# Patient Record
Sex: Female | Born: 1960 | Race: Black or African American | Hispanic: No | Marital: Married | Smoking: Never smoker
Health system: Southern US, Community
[De-identification: ages and names within clinical notes are randomized; demographics above are authoritative.]

## PROBLEM LIST (undated history)

## (undated) DIAGNOSIS — D472 Monoclonal gammopathy: Secondary | ICD-10-CM

## (undated) DIAGNOSIS — M1990 Unspecified osteoarthritis, unspecified site: Secondary | ICD-10-CM

## (undated) DIAGNOSIS — K219 Gastro-esophageal reflux disease without esophagitis: Secondary | ICD-10-CM

## (undated) DIAGNOSIS — I10 Essential (primary) hypertension: Secondary | ICD-10-CM

## (undated) DIAGNOSIS — E119 Type 2 diabetes mellitus without complications: Secondary | ICD-10-CM

## (undated) DIAGNOSIS — E7800 Pure hypercholesterolemia, unspecified: Secondary | ICD-10-CM

## (undated) HISTORY — PX: PANCREAS SURGERY: SHX731

## (undated) HISTORY — PX: CHOLECYSTECTOMY: SHX55

## (undated) HISTORY — PX: ABDOMINAL HYSTERECTOMY: SHX81

---

## 2007-11-28 ENCOUNTER — Ambulatory Visit: Payer: Self-pay | Admitting: Family Medicine

## 2008-12-23 ENCOUNTER — Ambulatory Visit: Payer: Self-pay | Admitting: Family Medicine

## 2009-11-05 ENCOUNTER — Ambulatory Visit: Payer: Self-pay | Admitting: Anesthesiology

## 2009-12-28 ENCOUNTER — Ambulatory Visit: Payer: Self-pay | Admitting: Family Medicine

## 2010-01-01 ENCOUNTER — Ambulatory Visit: Payer: Self-pay | Admitting: Podiatry

## 2010-12-05 ENCOUNTER — Ambulatory Visit: Payer: Self-pay | Admitting: Family Medicine

## 2011-02-22 ENCOUNTER — Ambulatory Visit: Payer: Self-pay | Admitting: Family Medicine

## 2011-09-24 ENCOUNTER — Ambulatory Visit: Payer: Self-pay | Admitting: Internal Medicine

## 2011-09-27 HISTORY — PX: BREAST BIOPSY: SHX20

## 2012-08-21 ENCOUNTER — Ambulatory Visit: Payer: Self-pay | Admitting: Family Medicine

## 2013-09-29 ENCOUNTER — Ambulatory Visit: Payer: Self-pay

## 2013-09-29 LAB — RAPID INFLUENZA A&B ANTIGENS

## 2013-10-31 ENCOUNTER — Ambulatory Visit: Payer: Self-pay | Admitting: Family Medicine

## 2014-11-03 ENCOUNTER — Ambulatory Visit: Payer: Self-pay | Admitting: Physician Assistant

## 2014-12-24 ENCOUNTER — Ambulatory Visit: Admit: 2014-12-24 | Disposition: A | Discharge status: Home / Self Care | Payer: Self-pay | Admitting: Family Medicine

## 2014-12-26 HISTORY — PX: BREAST BIOPSY: SHX20

## 2014-12-31 ENCOUNTER — Ambulatory Visit
Admit: 2014-12-31 | Disposition: A | Discharge status: Home / Self Care | Payer: Self-pay | Attending: Family Medicine | Admitting: Family Medicine

## 2015-01-05 ENCOUNTER — Ambulatory Visit
Admit: 2015-01-05 | Disposition: A | Discharge status: Home / Self Care | Payer: Self-pay | Attending: Family Medicine | Admitting: Family Medicine

## 2015-01-19 LAB — SURGICAL PATHOLOGY

## 2015-05-14 ENCOUNTER — Encounter: Payer: Self-pay | Admitting: Emergency Medicine

## 2015-05-14 ENCOUNTER — Ambulatory Visit
Admission: EM | Admit: 2015-05-14 | Discharge: 2015-05-14 | Disposition: A | Discharge status: Home / Self Care | Payer: Federal, State, Local not specified - PPO | Attending: Family Medicine | Admitting: Family Medicine

## 2015-05-14 DIAGNOSIS — M545 Low back pain, unspecified: Secondary | ICD-10-CM

## 2015-05-14 HISTORY — DX: Pure hypercholesterolemia, unspecified: E78.00

## 2015-05-14 HISTORY — DX: Type 2 diabetes mellitus without complications: E11.9

## 2015-05-14 HISTORY — DX: Essential (primary) hypertension: I10

## 2015-05-14 MED ORDER — MELOXICAM 15 MG PO TABS: 15.0000 mg | ORAL_TABLET | Freq: Every day | ORAL | Status: DC

## 2015-05-14 MED ORDER — ORPHENADRINE CITRATE ER 100 MG PO TB12: 100.0000 mg | ORAL_TABLET | Freq: Two times a day (BID) | ORAL | Status: DC

## 2015-05-14 MED ORDER — TRAMADOL HCL 50 MG PO TABS: 50.0000 mg | ORAL_TABLET | Freq: Four times a day (QID) | ORAL | Status: DC | PRN

## 2015-05-14 NOTE — ED Notes (Signed)
Patient c/o lower back pain after lifting a large pack of water bottles about a month ago.  Patient also report back spasms.

## 2015-05-14 NOTE — Discharge Instructions (Signed)
Back Exercises Back exercises help treat and prevent back injuries. The goal is to increase your strength in your belly (abdominal) and back muscles. These exercises can also help with flexibility. Start these exercises when told by your doctor. HOME CARE Back exercises include: Pelvic Tilt.  Lie on your back with your knees bent. Tilt your pelvis until the lower part of your back is against the floor. Hold this position 5 to 10 sec. Repeat this exercise 5 to 10 times. Knee to Chest.  Pull 1 knee up against your chest and hold for 20 to 30 seconds. Repeat this with the other knee. This may be done with the other leg straight or bent, whichever feels better. Then, pull both knees up against your chest. Sit-Ups or Curl-Ups.  Bend your knees 90 degrees. Start with tilting your pelvis, and do a partial, slow sit-up. Only lift your upper half 30 to 45 degrees off the floor. Take at least 2 to 3 seonds for each sit-up. Do not do sit-ups with your knees out straight. If partial sit-ups are difficult, simply do the above but with only tightening your belly (abdominal) muscles and holding it as told. Hip-Lift.  Lie on your back with your knees flexed 90 degrees. Push down with your feet and shoulders as you raise your hips 2 inches off the floor. Hold for 10 seconds, repeat 5 to 10 times. Back Arches.  Lie on your stomach. Prop yourself up on bent elbows. Slowly press on your hands, causing an arch in your low back. Repeat 3 to 5 times. Shoulder-Lifts.  Lie face down with arms beside your body. Keep hips and belly pressed to floor as you slowly lift your head and shoulders off the floor. Do not overdo your exercises. Be careful in the beginning. Exercises may cause you some mild back discomfort. If the pain lasts for more than 15 minutes, stop the exercises until you see your doctor. Improvement with exercise for back problems is slow.  Document Released: 10/15/2010 Document Revised: 12/05/2011  Document Reviewed: 07/14/2011 [facility] Patient Information 2015 [place], [place]. This information is not intended to replace advice given to you by your health care provider. Make sure you discuss any questions you have with your health care provider.  Back Pain, Adult Back pain is very common. The pain often gets better over time. The cause of back pain is usually not dangerous. Most people can learn to manage their back pain on their own.  HOME CARE   Stay active. Start with short walks on flat ground if you can. Try to walk farther each day.  Do not sit, drive, or stand in one place for more than 30 minutes. Do not stay in bed.  Do not avoid exercise or work. Activity can help your back heal faster.  Be careful when you bend or lift an object. Bend at your knees, keep the object close to you, and do not twist.  Sleep on a firm mattress. Lie on your side, and bend your knees. If you lie on your back, put a pillow under your knees.  Only take medicines as told by your doctor.  Put ice on the injured area.  Put ice in a plastic bag.  Place a towel between your skin and the bag.  Leave the ice on for 15-20 minutes, 03-04 times a day for the first 2 to 3 days. After that, you can switch between ice and heat packs.  Ask your doctor about back exercises  or massage.  Avoid feeling anxious or stressed. Find good ways to deal with stress, such as exercise. GET HELP RIGHT AWAY IF:   Your pain does not go away with rest or medicine.  Your pain does not go away in 1 week.  You have new problems.  You do not feel well.  The pain spreads into your legs.  You cannot control when you poop (bowel movement) or pee (urinate).  Your arms or legs feel weak or lose feeling (numbness).  You feel sick to your stomach (nauseous) or throw up (vomit).  You have belly (abdominal) pain.  You feel like you may pass out (faint). MAKE SURE YOU:   Understand these instructions.  Will watch  your condition.  Will get help right away if you are not doing well or get worse. Document Released: 02/29/2008 Document Revised: 12/05/2011 Document Reviewed: 01/14/2014 [facility] Patient Information 2015 [place], [place]. This information is not intended to replace advice given to you by your health care provider. Make sure you discuss any questions you have with your health care provider.

## 2015-05-14 NOTE — ED Provider Notes (Signed)
CSN: [id number]     Arrival date & time 05/14/15  0747 History   First MD Initiated Contact with Patient 05/14/15 [phone]     Chief Complaint  Patient presents with  . Back Pain   (Consider location/radiation/quality/duration/timing/severity/associated sxs/prior Treatment)    She reports hurting her back at home when lifting a case of water. She states that she felt some discomfort which case one in her car. When she got home and tried to take the water out of the car it really started bothering her. She admits that she did not lift with her feet but instead just try to move the water with her back. Patient is a 54 y.o. female presenting with back pain. The history is provided by the patient. No language interpreter was used.  Back Pain Location:  Lumbar spine Quality:  Aching and stiffness Stiffness is present:  All day Radiates to:  Does not radiate Pain severity:  Moderate Pain is:  Unable to specify Duration:  3 weeks Timing:  Intermittent Progression:  Unchanged Context: lifting heavy objects and recent injury   Context: not emotional stress, not falling, not MCA, not MVA, not occupational injury and not recent illness   Relieved by:  Muscle relaxants Worsened by:  Movement Ineffective treatments:  None tried Risk factors: lack of exercise     Past Medical History  Diagnosis Date  . Diabetes mellitus without complication   . Hypertension   . Hypercholesteremia    Past Surgical History  Procedure Laterality Date  . Pancreas surgery    . Cholecystectomy    . Abdominal hysterectomy     History reviewed. No pertinent family history. Social History  Substance Use Topics  . Smoking status: Never Smoker   . Smokeless tobacco: Never Used  . Alcohol Use: No   OB History    No data available     Review of Systems  Constitutional: Positive for activity change and appetite change.  Musculoskeletal: Positive for back pain.  All other systems reviewed and are  negative.   Allergies  Penicillins  Home Medications   Prior to Admission medications   Medication Sig Start Date End Date Taking? Authorizing Provider  amLODipine-atorvastatin (CADUET) 10-40 MG per tablet Take 1 tablet by mouth daily.   Yes Historical Provider, MD  aspirin 81 MG tablet Take 81 mg by mouth daily.   Yes Historical Provider, MD  cloNIDine (CATAPRES) 0.1 MG tablet Take 0.1 mg by mouth as needed.   Yes Historical Provider, MD  glipiZIDE-metformin (METAGLIP) 2.5-500 MG per tablet Take 2 tablets by mouth 2 (two) times daily before a meal.   Yes Historical Provider, MD  insulin glargine (LANTUS) 100 UNIT/ML injection Inject 42 Units into the skin daily.   Yes Historical Provider, MD  lisinopril (PRINIVIL,ZESTRIL) 20 MG tablet Take 20 mg by mouth daily.   Yes Historical Provider, MD  Multiple Vitamin (MULTIVITAMIN) tablet Take 1 tablet by mouth daily.   Yes Historical Provider, MD  meloxicam (MOBIC) 15 MG tablet Take 1 tablet (15 mg total) by mouth daily. 05/14/15   Frederich Cha, MD  orphenadrine (NORFLEX) 100 MG tablet Take 1 tablet (100 mg total) by mouth 2 (two) times daily. 05/14/15   Frederich Cha, MD  traMADol (ULTRAM) 50 MG tablet Take 1 tablet (50 mg total) by mouth every 6 (six) hours as needed. 05/14/15   Frederich Cha, MD   BP 131/88 mmHg  Pulse 96  Temp(Src) 98.1 F (36.7 C) (Oral)  Resp 16  Ht 5\' 5"  (1.651 m)  Wt 180 lb (81.647 kg)  BMI 29.95 kg/m2  SpO2 98% Physical Exam  Constitutional: She is oriented to person, place, and time. She appears well-developed and well-nourished.  HENT:  Head: Normocephalic.  Eyes: Pupils are equal, round, and reactive to light.  Musculoskeletal: Normal range of motion. She exhibits tenderness. She exhibits no edema.       Lumbar back: She exhibits tenderness.       Back:  Muscle spasms tenderness over the right lower lumbar area.  Neurological: She is alert and oriented to person, place, and time. She has normal reflexes.  Skin:  Skin is warm and dry.  Psychiatric: She has a normal mood and affect. Her behavior is normal.  Vitals reviewed.   ED Course  Procedures (including critical care time) Labs Review Labs Reviewed - No data to display  Imaging Review No results found.   MDM   1. Left-sided low back pain without sciatica     Discussed patient since there was no direct trauma to the back even happened several weeks ago would still hold off on obtaining x-rays of the back at this time. Because the Flexeril made her sleepy which are Norflex 100 mg twice a day Mobic 15 mg daily and tramadol mainly take it night to see if this helps. I strongly suggest that if not better by Monday that she see a chiropractor for chiropractic therapy. Recommend [facility]. Off work excuse for today and tomorrow but she is actually off until Monday on vacation.   Frederich Cha, MD 05/14/15 [phone]

## 2015-11-21 ENCOUNTER — Ambulatory Visit
Admission: EM | Admit: 2015-11-21 | Discharge: 2015-11-21 | Disposition: A | Discharge status: Home / Self Care | Payer: Federal, State, Local not specified - PPO | Attending: Family Medicine | Admitting: Family Medicine

## 2015-11-21 DIAGNOSIS — B349 Viral infection, unspecified: Secondary | ICD-10-CM

## 2015-11-21 LAB — RAPID INFLUENZA A&B ANTIGENS (ARMC ONLY): INFLUENZA B (ARMC): NOT DETECTED

## 2015-11-21 LAB — RAPID INFLUENZA A&B ANTIGENS: Influenza A (ARMC): NOT DETECTED

## 2015-11-21 MED ORDER — BENZONATATE 200 MG PO CAPS
200.0000 mg | ORAL_CAPSULE | Freq: Three times a day (TID) | ORAL | Status: DC
Start: 2015-11-21 — End: 2017-06-28

## 2015-11-21 MED ORDER — OSELTAMIVIR PHOSPHATE 75 MG PO CAPS: 75.0000 mg | ORAL_CAPSULE | Freq: Two times a day (BID) | ORAL | Status: DC

## 2015-11-21 NOTE — ED Notes (Signed)
Patient c/o chills, coughing, body aches, fever, and slight sore throat which all started this past Thursday.

## 2015-11-21 NOTE — ED Provider Notes (Signed)
CSN: [id number]     Arrival date & time 11/21/15  [id number] History   First MD Initiated Contact with Patient 11/21/15 1025     Chief Complaint  Patient presents with  . Cough  . Generalized Body Aches   (Consider location/radiation/quality/duration/timing/severity/associated sxs/prior Treatment) Patient is a 55 y.o. female presenting with URI. The history is provided by the patient.  URI Presenting symptoms: congestion, cough, fatigue, fever, rhinorrhea and sore throat   Severity:  Moderate Onset quality:  Sudden Duration:  2 days Timing:  Constant Progression:  Worsening Chronicity:  New Relieved by:  None tried Worsened by:  Nothing tried Ineffective treatments:  None tried Associated symptoms: arthralgias, headaches and myalgias   Associated symptoms: no neck pain, no sinus pain and no wheezing   Risk factors: diabetes mellitus and sick contacts   Risk factors: not elderly, no chronic cardiac disease, no chronic kidney disease, no chronic respiratory disease, no immunosuppression, no recent illness and no recent travel     Past Medical History  Diagnosis Date  . Diabetes mellitus without complication ([place])   . Hypertension   . Hypercholesteremia    Past Surgical History  Procedure Laterality Date  . Pancreas surgery    . Cholecystectomy    . Abdominal hysterectomy     History reviewed. No pertinent family history. Social History  Substance Use Topics  . Smoking status: Never Smoker   . Smokeless tobacco: Never Used  . Alcohol Use: No   OB History    No data available     Review of Systems  Constitutional: Positive for fever and fatigue.  HENT: Positive for congestion, rhinorrhea and sore throat.   Respiratory: Positive for cough. Negative for wheezing.   Musculoskeletal: Positive for myalgias and arthralgias. Negative for neck pain.  Neurological: Positive for headaches.    Allergies  Penicillins  Home Medications   Prior to Admission medications    Medication Sig Start Date End Date Taking? Authorizing Provider  amLODipine-atorvastatin (CADUET) 10-40 MG per tablet Take 1 tablet by mouth daily.   Yes Historical Provider, MD  aspirin 81 MG tablet Take 81 mg by mouth daily.   Yes Historical Provider, MD  glipiZIDE-metformin (METAGLIP) 2.5-500 MG per tablet Take 2 tablets by mouth 2 (two) times daily before a meal.   Yes Historical Provider, MD  insulin glargine (LANTUS) 100 UNIT/ML injection Inject 42 Units into the skin daily.   Yes Historical Provider, MD  lisinopril (PRINIVIL,ZESTRIL) 20 MG tablet Take 20 mg by mouth daily.   Yes Historical Provider, MD  Multiple Vitamin (MULTIVITAMIN) tablet Take 1 tablet by mouth daily.   Yes Historical Provider, MD  benzonatate (TESSALON) 200 MG capsule Take 1 capsule (200 mg total) by mouth every 8 (eight) hours. 11/21/15   Norval Gable, MD  cloNIDine (CATAPRES) 0.1 MG tablet Take 0.1 mg by mouth as needed.    Historical Provider, MD  meloxicam (MOBIC) 15 MG tablet Take 1 tablet (15 mg total) by mouth daily. 05/14/15   Frederich Cha, MD  orphenadrine (NORFLEX) 100 MG tablet Take 1 tablet (100 mg total) by mouth 2 (two) times daily. 05/14/15   Frederich Cha, MD  oseltamivir (TAMIFLU) 75 MG capsule Take 1 capsule (75 mg total) by mouth 2 (two) times daily. 11/21/15   Norval Gable, MD  traMADol (ULTRAM) 50 MG tablet Take 1 tablet (50 mg total) by mouth every 6 (six) hours as needed. 05/14/15   Frederich Cha, MD   Meds Ordered and Administered this Visit  Medications - No data to display  BP 139/95 mmHg  Pulse 92  Temp(Src) 98.2 F (36.8 C) (Oral)  Resp 16  Ht 5\' 8"  (1.727 m)  Wt 180 lb (81.647 kg)  BMI 27.38 kg/m2  SpO2 100% No data found.   Physical Exam  Constitutional: She appears well-developed and well-nourished. No distress.  HENT:  Head: Normocephalic.  Right Ear: Tympanic membrane, external ear and ear canal normal.  Left Ear: Tympanic membrane, external ear and ear canal normal.  Nose:  Rhinorrhea present.  Mouth/Throat: Uvula is midline and mucous membranes are normal. Posterior oropharyngeal erythema present. No oropharyngeal exudate, posterior oropharyngeal edema or tonsillar abscesses.  Eyes: Conjunctivae and EOM are normal. Pupils are equal, round, and reactive to light. Right eye exhibits no discharge. Left eye exhibits no discharge. No scleral icterus.  Neck: Normal range of motion. Neck supple. No JVD present. No tracheal deviation present. No thyromegaly present.  Cardiovascular: Normal rate, regular rhythm, normal heart sounds and intact distal pulses.   No murmur heard. Pulmonary/Chest: Effort normal and breath sounds normal. No stridor. No respiratory distress. She has no wheezes. She has no rales. She exhibits no tenderness.  Lymphadenopathy:    She has no cervical adenopathy.  Skin: She is not diaphoretic.  Vitals reviewed.   ED Course  Procedures (including critical care time)  Labs Review Labs Reviewed  RAPID INFLUENZA A&B ANTIGENS ([place])    Imaging Review No results found.   Visual Acuity Review  Right Eye Distance:   Left Eye Distance:   Bilateral Distance:    Right Eye Near:   Left Eye Near:    Bilateral Near:         MDM   1. Viral syndrome      Discharge Medication List as of 11/21/2015 11:44 AM    START taking these medications   Details  benzonatate (TESSALON) 200 MG capsule Take 1 capsule (200 mg total) by mouth every 8 (eight) hours., Starting 11/21/2015, Until Discontinued, Normal    oseltamivir (TAMIFLU) 75 MG capsule Take 1 capsule (75 mg total) by mouth 2 (two) times daily., Starting 11/21/2015, Until Discontinued, Normal       1.diagnosis reviewed with patient 2. rx as per orders above; reviewed possible side effects, interactions, risks and benefits  3. Recommend supportive treatment with increased fluids, otc analgesics, rest 4. Follow-up prn if symptoms worsen or don't improve   Norval Gable,  MD 11/21/15 1558

## 2017-02-24 IMAGING — MG MM POST US BIOPSY *L*
2 series · 2 of 2 positions shown · non-contrast
Comparison: Previous exam(s).

CLINICAL DATA: Status post ultrasound-guided core biopsy of a left
breast mass.

EXAM:
DIAGNOSTIC LEFT MAMMOGRAM POST ULTRASOUND BIOPSY

[L CC]
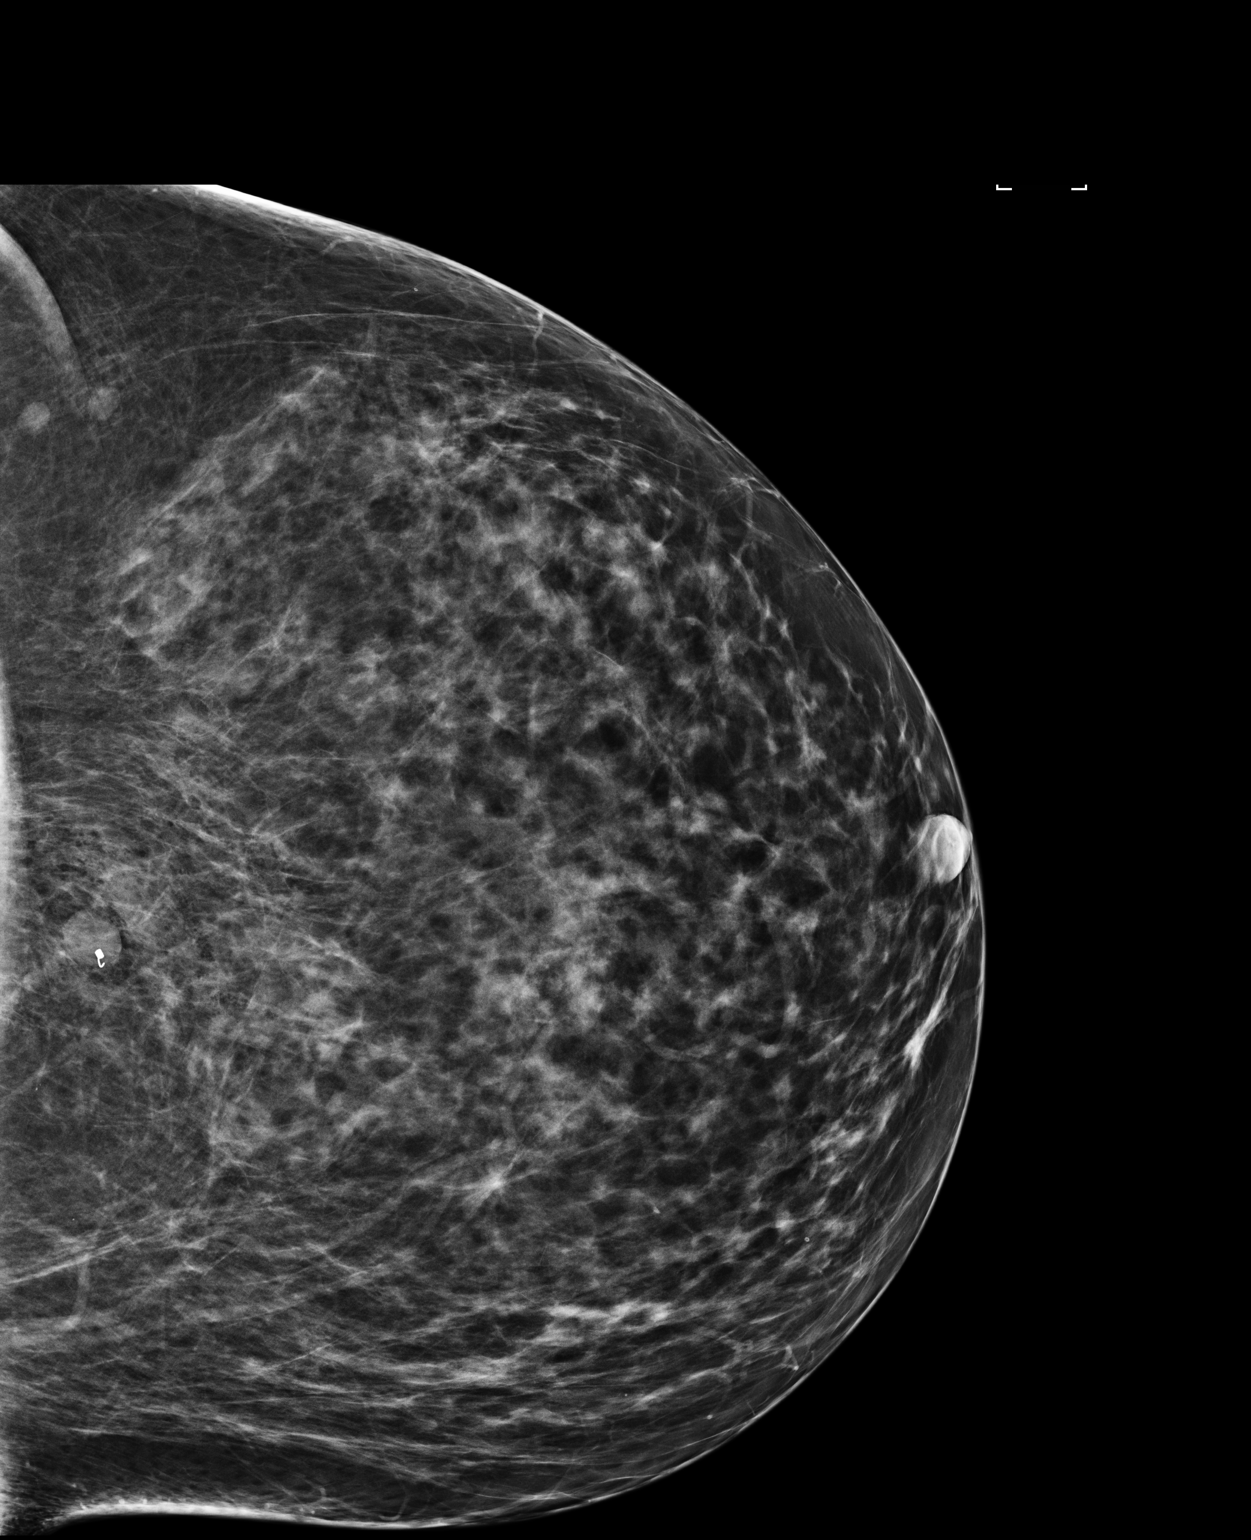

[L LM]
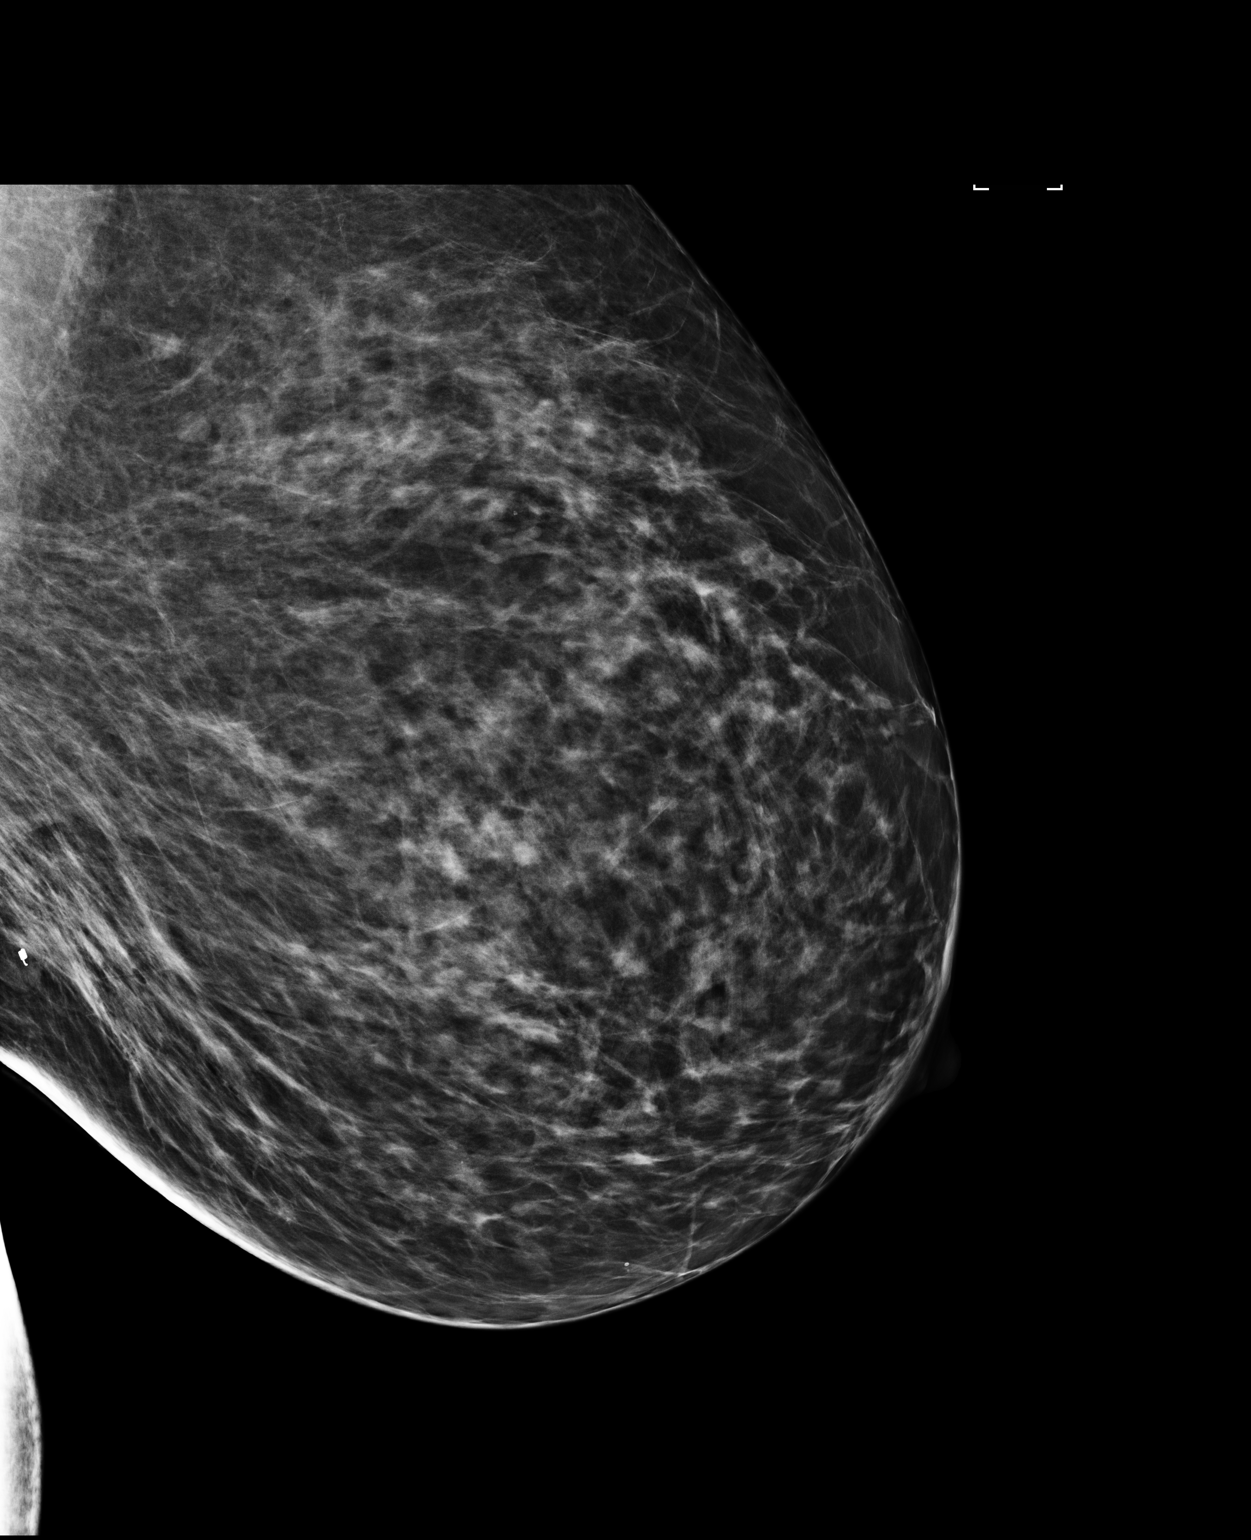

[2 of 2 positions shown; findings below may reference images not displayed]

FINDINGS: Mammographic images were obtained following ultrasound guided biopsy
of a mass in the 6 o'clock region of the left breast. Mammographic
images show there is a coil shaped clip associated with the mass in
the 6 o'clock region of the left breast.
IMPRESSION: Status post ultrasound-guided core biopsy of the left breast with
pathology pending.

Final Assessment: Post Procedure Mammograms for Marker Placement

## 2017-02-24 IMAGING — US US BIOPSY BREAST CORE W/ IMAGING
1 series · 8 of 8 positions shown · non-contrast
Comparison: Previous exam(s).

ADDENDUM:
Pathology of the left breast biopsy revealed FIBROADENOMA WITH MILD
DUCTAL EPITHELIAL HYPERPLASIA. NO CYTOLOGIC ATYPIA OR MALIGNANCY
SEEN. This was found to be concordant by Dr. Jacono.

At the patient's request, pathology was relayed to the patient by
phone. She stated she has some soreness but no bleeding, bruising,
or hematoma at the biopsy site. Post biopsy instructions were
reviewed with the patient. All of her questions were answered. She
was encouraged to call the [REDACTED]
Recommendations: Return to bilateral screening mammogram in one
year.
Pathology relayed by Milano, Marito on 01/07/15 at 0222.
CLINICAL DATA: Left breast mass.
EXAM:
ULTRASOUND GUIDED LEFT BREAST CORE NEEDLE BIOPSY

[Series 1: us biopsy breast core w/ imaging · 0.08mm/px · 8 of 8 slices shown]
[im 1/8]
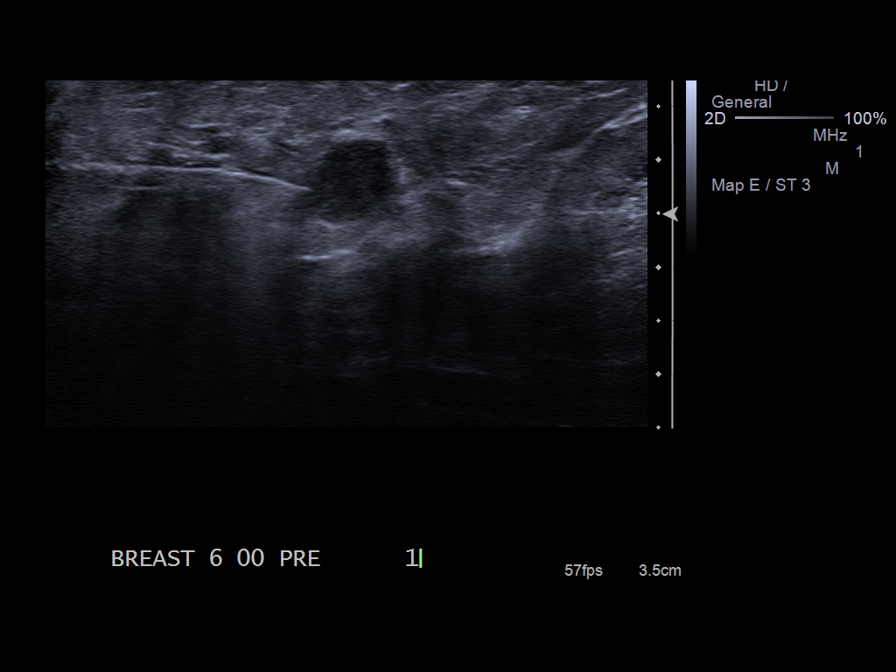
[im 2/8]
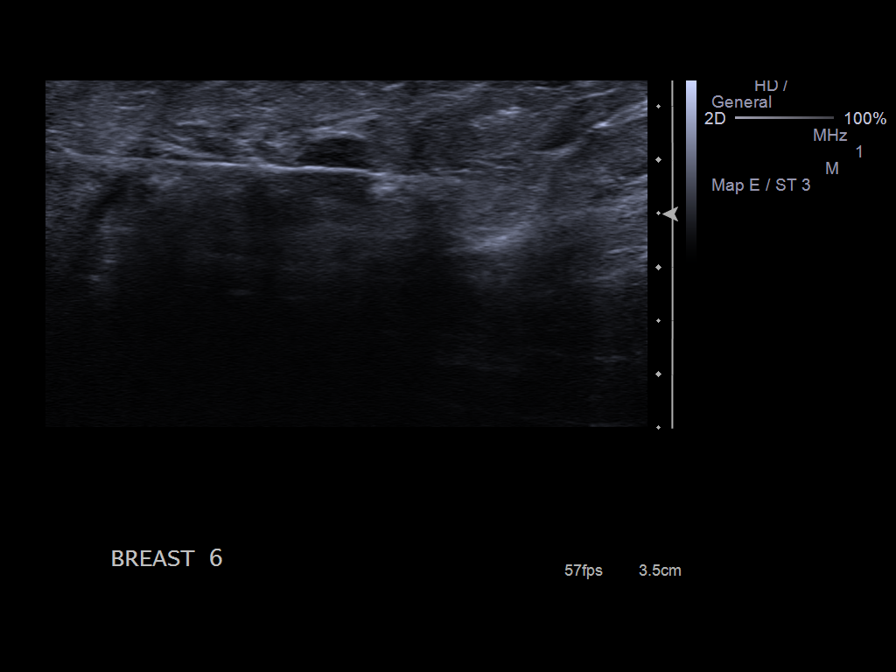
[im 3/8]
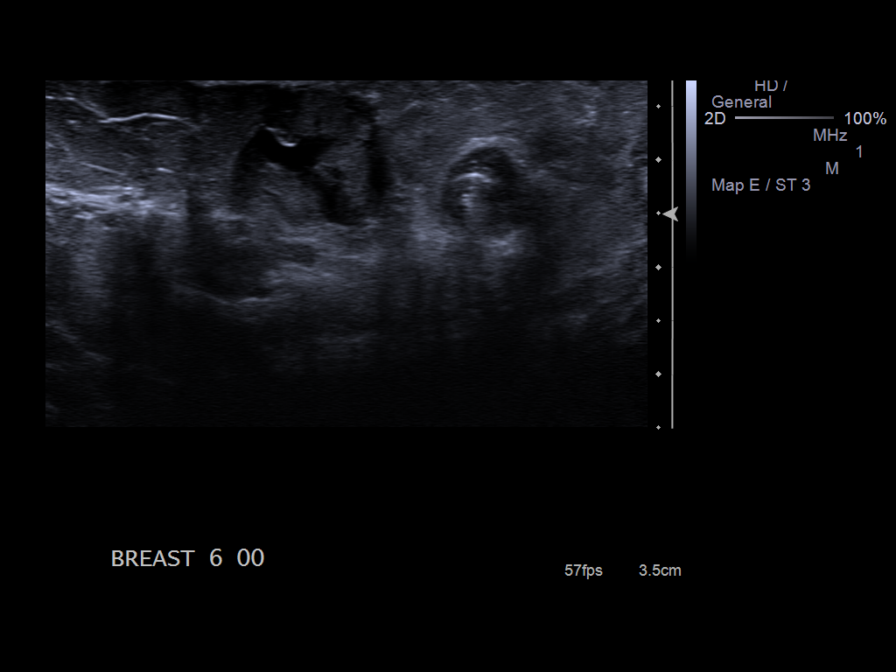
[im 4/8]
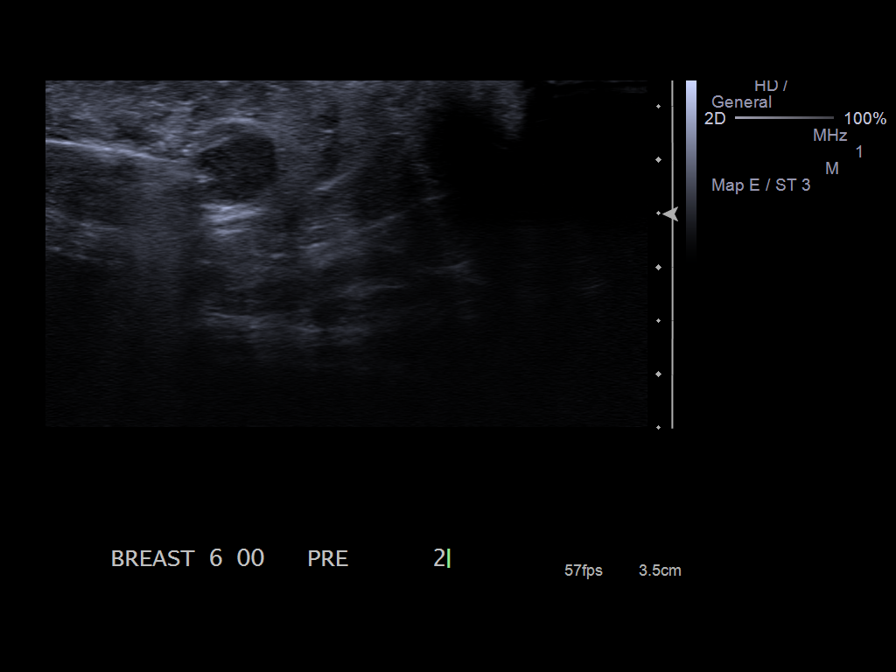
[im 5/8]
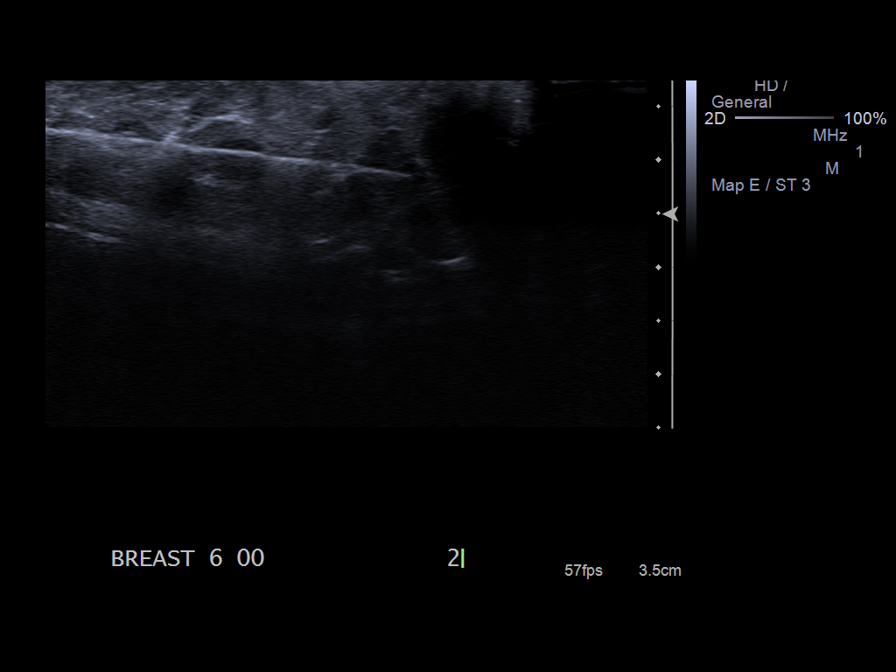
[im 6/8]
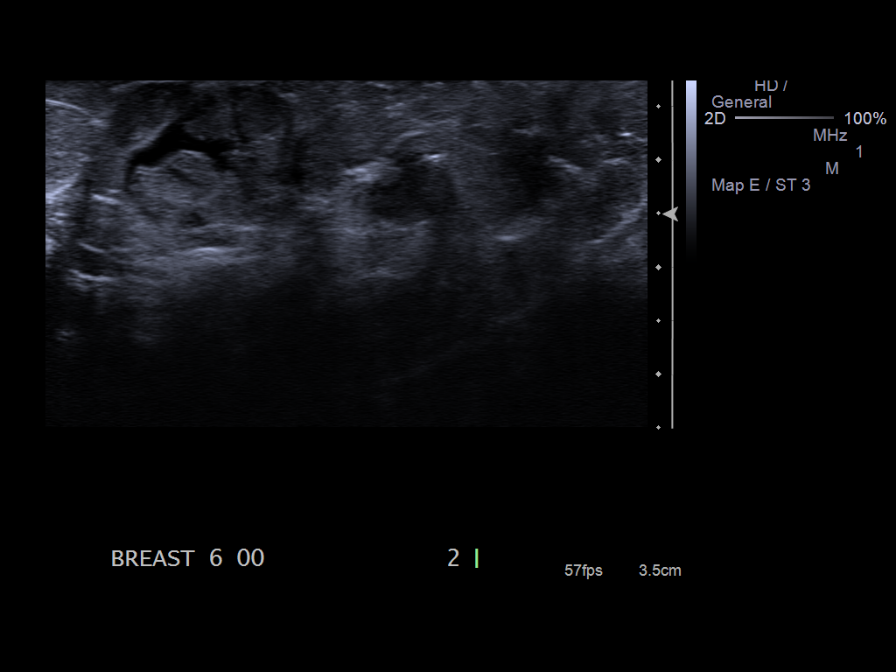
[im 7/8]
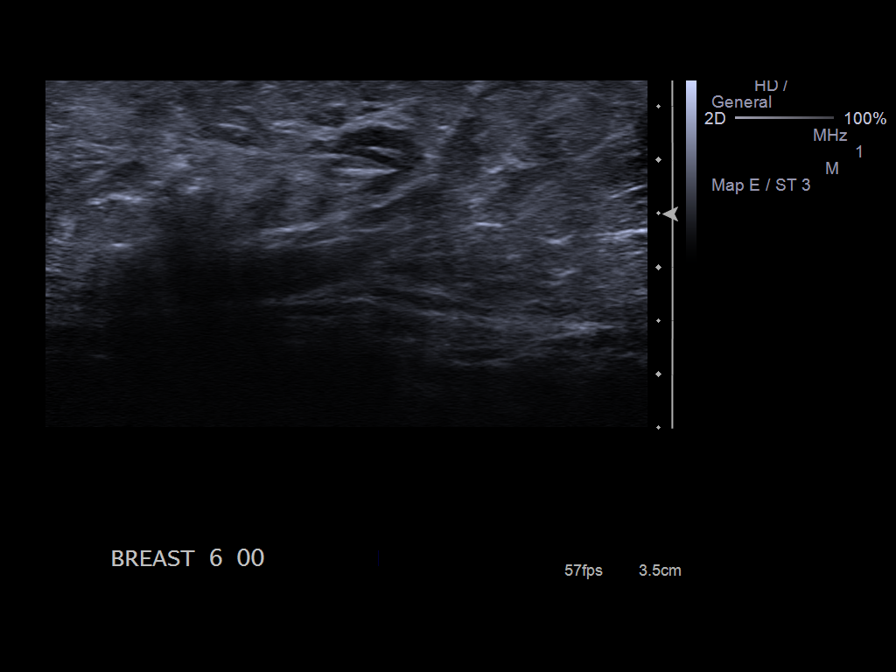
[im 8/8]
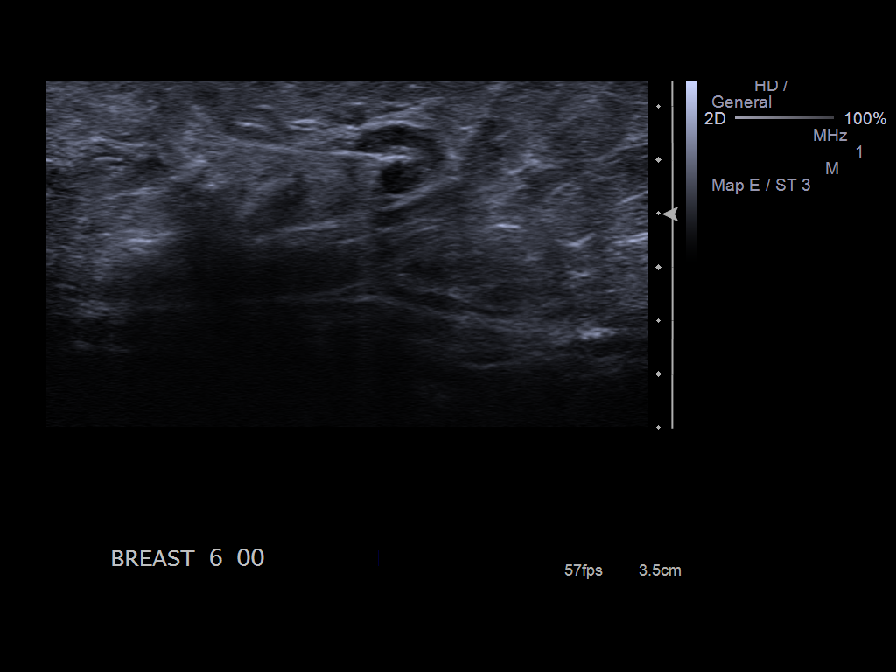

[8 of 8 positions shown; findings below may reference images not displayed]

PROCEDURE:
I met with the patient and we discussed the procedure of
ultrasound-guided biopsy, including benefits and alternatives. We
discussed the high likelihood of a successful procedure. We
discussed the risks of the procedure including infection, bleeding,
tissue injury, clip migration, and inadequate sampling. Informed
written consent was given. The usual time-out protocol was performed
immediately prior to the procedure.

Using sterile technique and 2% Lidocaine as local anesthetic, under
direct ultrasound visualization, a 12 gauge vacuum-assisted device
was used to perform biopsy of a mass in the 6 o'clock region of the
left breastusing a lateral to medial approach. At the conclusion of
the procedure, a coil shaped tissue marker clip was deployed into
the biopsy cavity. Follow-up 2-view mammogram was performed and
dictated separately.
IMPRESSION: Ultrasound-guided biopsy of the left breast. No apparent
complications.

## 2017-03-21 ENCOUNTER — Other Ambulatory Visit: Payer: Self-pay | Admitting: Family Medicine

## 2017-03-21 DIAGNOSIS — Z1231 Encounter for screening mammogram for malignant neoplasm of breast: Secondary | ICD-10-CM

## 2017-03-27 ENCOUNTER — Ambulatory Visit
Admission: RE | Admit: 2017-03-27 | Discharge: 2017-03-27 | Disposition: A | Discharge status: Home / Self Care | Payer: Federal, State, Local not specified - PPO | Source: Ambulatory Visit | Attending: Family Medicine | Admitting: Family Medicine

## 2017-03-27 DIAGNOSIS — Z1231 Encounter for screening mammogram for malignant neoplasm of breast: Secondary | ICD-10-CM | POA: Diagnosis not present

## 2017-06-28 ENCOUNTER — Encounter: Payer: Self-pay | Admitting: Emergency Medicine

## 2017-06-28 ENCOUNTER — Ambulatory Visit
Admission: EM | Admit: 2017-06-28 | Discharge: 2017-06-28 | Disposition: A | Discharge status: Home / Self Care | Payer: Federal, State, Local not specified - PPO | Attending: Family Medicine | Admitting: Family Medicine

## 2017-06-28 DIAGNOSIS — M5441 Lumbago with sciatica, right side: Secondary | ICD-10-CM | POA: Diagnosis not present

## 2017-06-28 DIAGNOSIS — M545 Low back pain: Secondary | ICD-10-CM

## 2017-06-28 DIAGNOSIS — M5442 Lumbago with sciatica, left side: Secondary | ICD-10-CM | POA: Diagnosis not present

## 2017-06-28 MED ORDER — PREDNISONE 10 MG PO TABS: ORAL_TABLET | ORAL | 0 refills | Status: DC

## 2017-06-28 MED ORDER — CYCLOBENZAPRINE HCL 10 MG PO TABS: 10.0000 mg | ORAL_TABLET | Freq: Two times a day (BID) | ORAL | 0 refills | Status: AC | PRN

## 2017-06-28 NOTE — ED Provider Notes (Signed)
[facility] ____________________________________________  Time seen: Approximately 0950 AM  I have reviewed the triage vital signs and the nursing notes.   HISTORY  Chief Complaint Back Pain   HPI Doniesha Landau is a 56 y.o. female presenting for evaluation of low back pain that is in present since Sunday. Patient reports she has a history of similar happening and resolved with a steroid shot in her back from her orthopedist. Patient denies any recent fall, injury, trauma or known trigger. States that Sunday afternoon she noticed some mild pain with movement, and reports Monday morning after sleeping pain had worsened. Patient reports pain is primarily with movement and described as a catching spasming pain. Reports she does have some radiation down bilateral thighs intermittently, stating it does not go past thighs. Patient reports current pain is consistent with previous back pain. Again denies injury. Does report she has 2 young grandkids that she frequently bends and twists and moves with. Denies any abrupt onset. Denies any urinary or bowel retention or incontinence, paresthesias, decreased range of motion, fevers, abdominal complaints, dysuria or other complaints. States pain is been unresolved with over-the-counter Tylenol and ibuprofen. Denies other aggravating or alleviating factors. States pain is currently mild to moderate.Denies chest pain, shortness of breath, abdominal pain, dysuria, extremity pain, extremity swelling or rash. Denies recent sickness. Denies recent antibiotic use.    Past Medical History:  Diagnosis Date  . Diabetes mellitus without complication ([place])   . Hypercholesteremia   . Hypertension     There are no active problems to display for this patient.   Past Surgical History:  Procedure Laterality Date  . ABDOMINAL HYSTERECTOMY    . BREAST BIOPSY Left 12/2014   u/s bx/clip-neg  . BREAST BIOPSY Right 2013   bx/clip-neg  .  CHOLECYSTECTOMY    . PANCREAS SURGERY       No current facility-administered medications for this encounter.   Current Outpatient Prescriptions:  .  insulin aspart (NOVOLOG) 100 UNIT/ML injection, Inject 10 Units into the skin 3 (three) times daily before meals., Disp: , Rfl:  .  Insulin Glargine (TOUJEO MAX SOLOSTAR) 300 UNIT/ML SOPN, Inject 45 Units into the skin daily., Disp: , Rfl:  .  metFORMIN (GLUCOPHAGE) 500 MG tablet, Take 2,000 mg by mouth daily with breakfast., Disp: , Rfl:  .  ranitidine (ZANTAC) 150 MG tablet, Take 150 mg by mouth 2 (two) times daily., Disp: , Rfl:  .  amLODipine-atorvastatin (CADUET) 10-40 MG per tablet, Take 1 tablet by mouth daily., Disp: , Rfl:  .  aspirin 81 MG tablet, Take 81 mg by mouth daily., Disp: , Rfl:  .  cloNIDine (CATAPRES) 0.1 MG tablet, Take 0.1 mg by mouth as needed., Disp: , Rfl:  .  cyclobenzaprine (FLEXERIL) 10 MG tablet, Take 1 tablet (10 mg total) by mouth 2 (two) times daily as needed for muscle spasms. Do not drive while taking as can cause drowsiness, Disp: 15 tablet, Rfl: 0 .  lisinopril (PRINIVIL,ZESTRIL) 20 MG tablet, Take 20 mg by mouth daily., Disp: , Rfl:  .  meloxicam (MOBIC) 15 MG tablet, Take 1 tablet (15 mg total) by mouth daily., Disp: 30 tablet, Rfl: 1 .  Multiple Vitamin (MULTIVITAMIN) tablet, Take 1 tablet by mouth daily., Disp: , Rfl:  .  predniSONE (DELTASONE) 10 MG tablet, Start 60 mg po day one, then 50 mg po day two, taper by 10 mg daily until complete., Disp: 21 tablet, Rfl: 0  Allergies Penicillins  Family History  Problem Relation Age of Onset  . Diabetes Mother   . Hypertension Mother   . Diabetes Father   . COPD Father   . Breast cancer Neg Hx     Social History Social History  Substance Use Topics  . Smoking status: Never Smoker  . Smokeless tobacco: Never Used  . Alcohol use No    Review of Systems Constitutional: No fever/chills Cardiovascular: Denies chest pain. Respiratory: Denies  shortness of breath. Gastrointestinal: No abdominal pain.  No nausea, no vomiting.  No diarrhea.  No constipation. Genitourinary: Negative for dysuria. Musculoskeletal: Positive for back pain. Skin: Negative for rash.   ____________________________________________   PHYSICAL EXAM:  VITAL SIGNS: ED Triage Vitals  Enc Vitals Group     BP 06/28/17 0934 127/78     Pulse Rate 06/28/17 0934 86     Resp 06/28/17 0934 16     Temp 06/28/17 0934 98.5 F (36.9 C)     Temp Source 06/28/17 0934 Oral     SpO2 06/28/17 0934 99 %     Weight 06/28/17 0930 179 lb (81.2 kg)     Height 06/28/17 0930 5\' 6"  (1.676 m)     Head Circumference --      Peak Flow --      Pain Score 06/28/17 0930 9     Pain Loc --      Pain Edu? --      Excl. in [place]? --     Constitutional: Alert and oriented. Well appearing and in no acute distress. Eyes: Conjunctivae are normal. PERRL. EOMI. ENT      Head: Normocephalic and atraumatic.      Nose: No congestion/rhinnorhea.      Mouth/Throat: Mucous membranes are moist.Oropharynx non-erythematous. Neck: No stridor. Supple without meningismus.  Hematological/Lymphatic/Immunilogical: No cervical lymphadenopathy. Cardiovascular: Normal rate, regular rhythm. Grossly normal heart sounds.  Good peripheral circulation. Respiratory: Normal respiratory effort without tachypnea nor retractions. Breath sounds are clear and equal bilaterally. No wheezes, rales, rhonchi. Gastrointestinal: Soft and nontender. No distention. Normal Bowel sounds. No CVA tenderness. Musculoskeletal:  Nontender with normal range of motion in all extremities. No midline cervical or thoracic tenderness to palpation. Bilateral pedal pulses equal and easily palpated.Mild midline lower lumbar tenderness to palpation, mild to moderate bilateral paralumbar tenderness to palpation, no swelling, no ecchymosis, no skin changes noted, pain increases with lumbar flexion and extension, mild pain with lumbar rotation,  mild pain to lumbar with standing bilateral knee lifts, bilateral lower extremities nontender to palpation, no saddle anesthesia, ambulatory with steady gait, change positions quickly in room, bilateral plantar flexion and dorsiflexion strong and equal.  Neurologic:  Normal speech and language. No gross focal neurologic deficits are appreciated. Speech is normal. No gait instability.  Skin:  Skin is warm, dry and intact. No rash noted. Psychiatric: Mood and affect are normal. Speech and behavior are normal. Patient exhibits appropriate insight and judgment   ___________________________________________   LABS (all labs ordered are listed, but only abnormal results are displayed)  Labs Reviewed - No data to display   PROCEDURES Procedures   INITIAL IMPRESSION / ASSESSMENT AND PLAN / ED COURSE  Pertinent labs & imaging results that were available during my care of the patient were reviewed by me and considered in my medical decision making (see chart for details).  Well appearing patient. Presenting for acute onset of low back pain. No focal neurological deficits noted. Patient with history of similar occurring in past resolved with intra-joint steroid injection. No  recent trauma. Discussed evaluation of x-ray with patient, will defer at this time. Will treat patient with oral prednisone taper and when necessary Flexeril. Patient states she has follow-up with orthopedics this coming Monday for the same complaint. Discussed strict follow-up and return parameters. Encourage rest, fluids, supportive care and stretching. Discussed monitoring blood sugar with medications.Discussed indication, risks and benefits of medications with patient.  Discussed follow up with Primary care physician this week. Discussed follow up and return parameters including no resolution or any worsening concerns. Patient verbalized understanding and agreed to plan.   ____________________________________________   FINAL  CLINICAL IMPRESSION(S) / ED DIAGNOSES  Final diagnoses:  Acute bilateral low back pain with bilateral sciatica     Discharge Medication List as of 06/28/2017  9:58 AM    START taking these medications   Details  cyclobenzaprine (FLEXERIL) 10 MG tablet Take 1 tablet (10 mg total) by mouth 2 (two) times daily as needed for muscle spasms. Do not drive while taking as can cause drowsiness, Starting Wed 06/28/2017, Normal    predniSONE (DELTASONE) 10 MG tablet Start 60 mg po day one, then 50 mg po day two, taper by 10 mg daily until complete., Normal        Note: This dictation was prepared with Dragon dictation along with smaller phrase technology. Any transcriptional errors that result from this process are unintentional.         Marylene Land, NP 06/28/17 1120

## 2017-06-28 NOTE — Discharge Instructions (Signed)
Take medication as prescribed. Rest. Drink plenty of fluids.  ° °Follow up with your primary care physician this week as needed. Return to Urgent care for new or worsening concerns.  ° °

## 2017-06-28 NOTE — ED Triage Notes (Signed)
Patient c/o lower back pain that started on Sunday. Patient reports pain goes down both legs.  Patient denies injury or fall.

## 2018-05-03 ENCOUNTER — Ambulatory Visit
Admission: EM | Admit: 2018-05-03 | Discharge: 2018-05-03 | Disposition: A | Discharge status: Home / Self Care | Payer: Federal, State, Local not specified - PPO | Attending: Family Medicine | Admitting: Family Medicine

## 2018-05-03 DIAGNOSIS — J029 Acute pharyngitis, unspecified: Secondary | ICD-10-CM | POA: Diagnosis not present

## 2018-05-03 DIAGNOSIS — H9209 Otalgia, unspecified ear: Secondary | ICD-10-CM

## 2018-05-03 LAB — RAPID STREP SCREEN (MED CTR MEBANE ONLY): STREPTOCOCCUS, GROUP A SCREEN (DIRECT): NEGATIVE

## 2018-05-03 MED ORDER — CETIRIZINE HCL 10 MG PO CAPS: 10.0000 mg | ORAL_CAPSULE | Freq: Every day | ORAL | 0 refills | Status: AC

## 2018-05-03 MED ORDER — FLUTICASONE PROPIONATE 50 MCG/ACT NA SUSP
1.0000 | Freq: Every day | NASAL | 2 refills | Status: DC
Start: 2018-05-03 — End: 2020-05-03

## 2018-05-03 NOTE — Discharge Instructions (Signed)

## 2018-05-03 NOTE — ED Provider Notes (Signed)
[facility]    CSN: [id number] Arrival date & time: 05/03/18  1634     History   Chief Complaint Chief Complaint  Patient presents with  . Sore Throat  . Otalgia    HPI Hadja Harral is a 57 y.o. female.   History of hypertension, hyperlipidemia and DM type II; Patient is presenting with URI symptoms- congestion, cough, sore throat.  She has developed right ear pain that began today.  This cough has relatively resolved.  Patient's main complaints are sore throat and ear pain.. Symptoms have been going on for 3 days. Patient has tried lozenges, with minimal relief. Denies fever, nausea, vomiting, diarrhea. Denies shortness of breath and chest pain.  Denies history of asthma and smoking.   HPI  Past Medical History:  Diagnosis Date  . Diabetes mellitus without complication ([place])   . Hypercholesteremia   . Hypertension     There are no active problems to display for this patient.   Past Surgical History:  Procedure Laterality Date  . ABDOMINAL HYSTERECTOMY    . BREAST BIOPSY Left 12/2014   u/s bx/clip-neg  . BREAST BIOPSY Right 2013   bx/clip-neg  . CHOLECYSTECTOMY    . PANCREAS SURGERY      OB History   None      Home Medications    Prior to Admission medications   Medication Sig Start Date End Date Taking? Authorizing Provider  amLODipine-atorvastatin (CADUET) 10-40 MG per tablet Take 1 tablet by mouth daily.   Yes [provider]  aspirin 81 MG tablet Take 81 mg by mouth daily.   Yes [provider]  cloNIDine (CATAPRES) 0.1 MG tablet Take 0.1 mg by mouth as needed.   Yes [provider]  cyclobenzaprine (FLEXERIL) 10 MG tablet Take 1 tablet (10 mg total) by mouth 2 (two) times daily as needed for muscle spasms. Do not drive while taking as can cause drowsiness 06/28/17  Yes Marylene Land, NP  insulin aspart (NOVOLOG) 100 UNIT/ML injection Inject 10 Units into the skin 3 (three) times daily before meals.   Yes  [provider]  Insulin Glargine (TOUJEO MAX SOLOSTAR) 300 UNIT/ML SOPN Inject 45 Units into the skin daily.   Yes [provider]  lisinopril (PRINIVIL,ZESTRIL) 20 MG tablet Take 20 mg by mouth daily.   Yes [provider]  meloxicam (MOBIC) 15 MG tablet Take 1 tablet (15 mg total) by mouth daily. 05/14/15  Yes Frederich Cha, MD  metFORMIN (GLUCOPHAGE) 500 MG tablet Take 2,000 mg by mouth daily with breakfast.   Yes [provider]  Multiple Vitamin (MULTIVITAMIN) tablet Take 1 tablet by mouth daily.   Yes [provider]  predniSONE (DELTASONE) 10 MG tablet Start 60 mg po day one, then 50 mg po day two, taper by 10 mg daily until complete. 06/28/17  Yes Marylene Land, NP  ranitidine (ZANTAC) 150 MG tablet Take 150 mg by mouth 2 (two) times daily.   Yes [provider]  Cetirizine HCl 10 MG CAPS Take 1 capsule (10 mg total) by mouth daily for 10 days. 05/03/18 05/13/18  Jessen Siegman C, PA-C  fluticasone (FLONASE) 50 MCG/ACT nasal spray Place 1-2 sprays into both nostrils daily. 05/03/18   Geronimo Diliberto, Elesa Hacker, PA-C    Family History Family History  Problem Relation Age of Onset  . Diabetes Mother   . Hypertension Mother   . Diabetes Father   . COPD Father   . Breast cancer Neg Hx  Social History Social History   Tobacco Use  . Smoking status: Never Smoker  . Smokeless tobacco: Never Used  Substance Use Topics  . Alcohol use: No  . Drug use: No     Allergies   Penicillins   Review of Systems Review of Systems  Constitutional: Negative for activity change, appetite change, chills, fatigue and fever.  HENT: Positive for congestion, ear pain, rhinorrhea and sore throat. Negative for sinus pressure and trouble swallowing.   Eyes: Negative for discharge and redness.  Respiratory: Positive for cough. Negative for chest tightness and shortness of breath.   Cardiovascular: Negative for chest pain.  Gastrointestinal: Negative for  abdominal pain, diarrhea, nausea and vomiting.  Musculoskeletal: Negative for myalgias.  Skin: Negative for rash.  Neurological: Negative for dizziness, light-headedness and headaches.     Physical Exam Triage Vital Signs ED Triage Vitals  Enc Vitals Group     BP 05/03/18 1659 130/88     Pulse Rate 05/03/18 1659 90     Resp 05/03/18 1659 16     Temp 05/03/18 1659 98.8 F (37.1 C)     Temp Source 05/03/18 1659 Oral     SpO2 05/03/18 1659 98 %     Weight 05/03/18 1658 179 lb (81.2 kg)     Height 05/03/18 1658 5\' 6"  (1.676 m)     Head Circumference --      Peak Flow --      Pain Score 05/03/18 1657 7     Pain Loc --      Pain Edu? --      Excl. in [place]? --    No data found.  Updated Vital Signs BP 130/88 (BP Location: Left Arm)   Pulse 90   Temp 98.8 F (37.1 C) (Oral)   Resp 16   Ht 5\' 6"  (1.676 m)   Wt 179 lb (81.2 kg)   SpO2 98%   BMI 28.89 kg/m   Visual Acuity Right Eye Distance:   Left Eye Distance:   Bilateral Distance:    Right Eye Near:   Left Eye Near:    Bilateral Near:     Physical Exam  Constitutional: She appears well-developed and well-nourished. No distress.  HENT:  Head: Normocephalic and atraumatic.  Bilateral ears without tenderness to palpation of external auricle, tragus and mastoid, EAC's without erythema or swelling, mild amount of cerumen in bilateral EACs, TMs partially visualized and appear pearly gray, no erythema.  Oral mucosa pink and moist, no tonsillar enlargement or exudate. Posterior pharynx patent and erythematous, no uvula deviation or swelling. Normal phonation.   Eyes: Conjunctivae are normal.  Neck: Neck supple.  Mild tonsillar node enlargement  Cardiovascular: Normal rate and regular rhythm.  No murmur heard. Pulmonary/Chest: Effort normal and breath sounds normal. No respiratory distress.  Breathing comfortably at rest, CTABL, no wheezing, rales or other adventitious sounds auscultated  Abdominal: Soft. There is no  tenderness.  Musculoskeletal: She exhibits no edema.  Neurological: She is alert.  Skin: Skin is warm and dry.  Psychiatric: She has a normal mood and affect.  Nursing note and vitals reviewed.    UC Treatments / Results  Labs (all labs ordered are listed, but only abnormal results are displayed) Labs Reviewed  RAPID STREP SCREEN ([facility] ONLY)  CULTURE, GROUP A STREP [facility])    EKG None  Radiology No results found.  Procedures Procedures (including critical care time)  Medications Ordered in UC Medications - No data to display  Initial Impression / Assessment and Plan / UC Course  I have reviewed the triage vital signs and the nursing notes.  Pertinent labs & imaging results that were available during my care of the patient were reviewed by me and considered in my medical decision making (see chart for details).     Patient tested negative for strep. No evidence of peritonsillar abscess or retropharyngeal abscess. Patient is nontoxic appearing, no drooling, dysphagia, muffled voice, or tripoding. No trismus.  Symptoms likely viral.  Exam unremarkable, ear pain likely related to node swelling near ear versus eustachian tube dysfunction and congestion.  Recommend symptom Public affairs consultant.  Recommend daily allergy pill.  With Flonase.  Discussed further OTC measures to control sore throat.Discussed strict return precautions. Patient verbalized understanding and is agreeable with plan.    Final Clinical Impressions(s) / UC Diagnoses   Final diagnoses:  Sore throat     Discharge Instructions     Sore Throat  Your rapid strep tested Negative today. We will send for a culture and call in about 2 days if results are positive. For now we will treat your sore throat as a virus with symptom management.   Please continue Tylenol or Ibuprofen for fever and pain. May try salt water gargles, cepacol lozenges, throat spray, or OTC cold relief medicine for throat discomfort.  If you also have congestion take a daily anti-histamine like Zyrtec, Claritin, and a oral decongestant to help with post nasal drip that may be irritating your throat.   Stay hydrated and drink plenty of fluids to keep your throat coated relieve irritation.     ED Prescriptions    Medication Sig Dispense Auth. Provider   Cetirizine HCl 10 MG CAPS Take 1 capsule (10 mg total) by mouth daily for 10 days. 15 capsule Corey Caulfield C, PA-C   fluticasone (FLONASE) 50 MCG/ACT nasal spray Place 1-2 sprays into both nostrils daily. 16 g Karigan Cloninger, [place] C, PA-C     Controlled Substance Prescriptions [place] Controlled Substance Registry consulted? Not Applicable   Janith Lima, [place] 05/03/18 1725

## 2018-05-03 NOTE — ED Triage Notes (Signed)
As per patient has sore throat from past 3 days and has ear pain Right side.

## 2018-05-06 LAB — CULTURE, GROUP A STREP (THRC)

## 2018-05-31 ENCOUNTER — Other Ambulatory Visit: Payer: Self-pay | Admitting: Family Medicine

## 2018-05-31 DIAGNOSIS — Z1231 Encounter for screening mammogram for malignant neoplasm of breast: Secondary | ICD-10-CM

## 2018-06-05 ENCOUNTER — Ambulatory Visit
Admission: RE | Admit: 2018-06-05 | Discharge: 2018-06-05 | Disposition: A | Discharge status: Home / Self Care | Payer: Federal, State, Local not specified - PPO | Source: Ambulatory Visit | Attending: Family Medicine | Admitting: Family Medicine

## 2018-06-05 DIAGNOSIS — Z1231 Encounter for screening mammogram for malignant neoplasm of breast: Secondary | ICD-10-CM | POA: Diagnosis not present

## 2019-04-22 ENCOUNTER — Other Ambulatory Visit: Payer: Self-pay | Admitting: Internal Medicine

## 2019-04-22 DIAGNOSIS — Z1231 Encounter for screening mammogram for malignant neoplasm of breast: Secondary | ICD-10-CM

## 2019-06-10 ENCOUNTER — Other Ambulatory Visit: Payer: Self-pay

## 2019-06-10 ENCOUNTER — Ambulatory Visit
Admission: RE | Admit: 2019-06-10 | Discharge: 2019-06-10 | Disposition: A | Discharge status: Home / Self Care | Payer: Federal, State, Local not specified - PPO | Source: Ambulatory Visit | Attending: Internal Medicine | Admitting: Internal Medicine

## 2019-06-10 DIAGNOSIS — Z1231 Encounter for screening mammogram for malignant neoplasm of breast: Secondary | ICD-10-CM | POA: Diagnosis not present

## 2020-01-22 ENCOUNTER — Other Ambulatory Visit: Payer: Self-pay | Admitting: Podiatry

## 2020-01-22 ENCOUNTER — Other Ambulatory Visit: Payer: Self-pay

## 2020-01-22 ENCOUNTER — Encounter: Payer: Self-pay | Admitting: Podiatry

## 2020-01-24 NOTE — Discharge Instructions (Signed)
[facility]  POST OPERATIVE INSTRUCTIONS FOR DR. TROXLER, DR. FOWLER, AND DR. [facility] PODIATRY [facility]PART[facility]   1. Take your medication as prescribed.  Pain medication should be taken only as needed.  2. Keep the dressing clean, dry and intact.  3. Keep your foot elevated above the heart level for the first 48 hours.  4. Walking to the bathroom and brief periods of walking are acceptable, unless we have instructed you to be non-weight bearing.  5. Always wear your post-op shoe when walking.  Always use your crutches if you are to be non-weight bearing.  6. Do not take a shower. Baths are permissible as long as the foot is kept out of the water.   7. Every hour you are awake:  - Bend your knee 15 times. - Flex foot 15 times - Massage calf 15 times  8. Call [facility] ([phone]) if any of the following problems occur: - You develop a temperature or fever. - The bandage becomes saturated with blood. - Medication does not stop your pain. - Injury of the foot occurs. - Any symptoms of infection including redness, odor, or red streaks running from wound.   General Anesthesia, Adult, Care After This sheet gives you information about how to care for yourself after your procedure. Your health care provider may also give you more specific instructions. If you have problems or questions, contact your health care provider. What can I expect after the procedure? After the procedure, the following side effects are common:  Pain or discomfort at the IV site.  Nausea.  Vomiting.  Sore throat.  Trouble concentrating.  Feeling cold or chills.  Weak or tired.  Sleepiness and fatigue.  Soreness and body aches. These side effects can affect parts of the body that were not involved in surgery. Follow these instructions at home:  For at least 24 hours after the procedure:  Have a responsible adult stay with you. It is  important to have someone help care for you until you are awake and alert.  Rest as needed.  Do not: ? Participate in activities in which you could fall or become injured. ? Drive. ? Use heavy machinery. ? Drink alcohol. ? Take sleeping pills or medicines that cause drowsiness. ? Make important decisions or sign legal documents. ? Take care of children on your own. Eating and drinking  Follow any instructions from your health care provider about eating or drinking restrictions.  When you feel hungry, start by eating small amounts of foods that are soft and easy to digest (bland), such as toast. Gradually return to your regular diet.  Drink enough fluid to keep your urine pale yellow.  If you vomit, rehydrate by drinking water, juice, or clear broth. General instructions  If you have sleep apnea, surgery and certain medicines can increase your risk for breathing problems. Follow instructions from your health care provider about wearing your sleep device: ? Anytime you are sleeping, including during daytime naps. ? While taking prescription pain medicines, sleeping medicines, or medicines that make you drowsy.  Return to your normal activities as told by your health care provider. Ask your health care provider what activities are safe for you.  Take over-the-counter and prescription medicines only as told by your health care provider.  If you smoke, do not smoke without supervision.  Keep all follow-up visits as told by your health care provider. This is important. Contact a health care provider if:    You have nausea or vomiting that does not get better with medicine.  You cannot eat or drink without vomiting.  You have pain that does not get better with medicine.  You are unable to pass urine.  You develop a skin rash.  You have a fever.  You have redness around your IV site that gets worse. Get help right away if:  You have difficulty breathing.  You have chest  pain.  You have blood in your urine or stool, or you vomit blood. Summary  After the procedure, it is common to have a sore throat or nausea. It is also common to feel tired.  Have a responsible adult stay with you for the first 24 hours after general anesthesia. It is important to have someone help care for you until you are awake and alert.  When you feel hungry, start by eating small amounts of foods that are soft and easy to digest (bland), such as toast. Gradually return to your regular diet.  Drink enough fluid to keep your urine pale yellow.  Return to your normal activities as told by your health care provider. Ask your health care provider what activities are safe for you. This information is not intended to replace advice given to you by your health care provider. Make sure you discuss any questions you have with your health care provider. Document Revised: 09/15/2017 Document Reviewed: 04/28/2017 Elsevier Patient Education  2020 [employer]  

## 2020-01-27 ENCOUNTER — Other Ambulatory Visit
Admission: RE | Admit: 2020-01-27 | Discharge: 2020-01-27 | Disposition: A | Discharge status: Home / Self Care | Payer: Federal, State, Local not specified - PPO | Source: Ambulatory Visit | Attending: Podiatry | Admitting: Podiatry

## 2020-01-27 ENCOUNTER — Other Ambulatory Visit: Payer: Self-pay

## 2020-01-27 DIAGNOSIS — Z20822 Contact with and (suspected) exposure to covid-19: Secondary | ICD-10-CM | POA: Diagnosis not present

## 2020-01-27 DIAGNOSIS — Z01812 Encounter for preprocedural laboratory examination: Secondary | ICD-10-CM | POA: Diagnosis not present

## 2020-01-27 LAB — SARS CORONAVIRUS 2 (TAT 6-24 HRS): SARS Coronavirus 2: NEGATIVE

## 2020-01-29 ENCOUNTER — Ambulatory Visit: Payer: Federal, State, Local not specified - PPO | Admitting: Anesthesiology

## 2020-01-29 ENCOUNTER — Ambulatory Visit
Admission: RE | Admit: 2020-01-29 | Discharge: 2020-01-29 | Disposition: A | Discharge status: Home / Self Care | Payer: Federal, State, Local not specified - PPO | Attending: Podiatry | Admitting: Podiatry

## 2020-01-29 ENCOUNTER — Other Ambulatory Visit: Payer: Self-pay

## 2020-01-29 ENCOUNTER — Encounter
Admission: RE | Disposition: A | Discharge status: Home / Self Care | Payer: Self-pay | Source: Home / Self Care | Attending: Podiatry

## 2020-01-29 ENCOUNTER — Encounter: Payer: Self-pay | Admitting: Podiatry

## 2020-01-29 DIAGNOSIS — Z794 Long term (current) use of insulin: Secondary | ICD-10-CM | POA: Diagnosis not present

## 2020-01-29 DIAGNOSIS — E119 Type 2 diabetes mellitus without complications: Secondary | ICD-10-CM | POA: Insufficient documentation

## 2020-01-29 DIAGNOSIS — Y831 Surgical operation with implant of artificial internal device as the cause of abnormal reaction of the patient, or of later complication, without mention of misadventure at the time of the procedure: Secondary | ICD-10-CM | POA: Insufficient documentation

## 2020-01-29 DIAGNOSIS — M2011 Hallux valgus (acquired), right foot: Secondary | ICD-10-CM | POA: Diagnosis not present

## 2020-01-29 DIAGNOSIS — T8484XA Pain due to internal orthopedic prosthetic devices, implants and grafts, initial encounter: Secondary | ICD-10-CM | POA: Diagnosis not present

## 2020-01-29 DIAGNOSIS — I10 Essential (primary) hypertension: Secondary | ICD-10-CM | POA: Insufficient documentation

## 2020-01-29 HISTORY — PX: OSTECTOMY: SHX6439

## 2020-01-29 HISTORY — DX: Unspecified osteoarthritis, unspecified site: M19.90

## 2020-01-29 HISTORY — PX: MINOR HARDWARE REMOVAL: SHX6474

## 2020-01-29 LAB — GLUCOSE, CAPILLARY
Glucose-Capillary: 71 mg/dL (ref 70–99)
Glucose-Capillary: 130 mg/dL — ABNORMAL HIGH (ref 70–99)
Glucose-Capillary: 87 mg/dL (ref 70–99)
Glucose-Capillary: 141 mg/dL — ABNORMAL HIGH (ref 70–99)

## 2020-01-29 SURGERY — OSTECTOMY
Anesthesia: General | Site: Foot | Laterality: Right

## 2020-01-29 MED ORDER — PHENYLEPHRINE HCL (PRESSORS) 10 MG/ML IV SOLN
INTRAVENOUS | Status: DC | PRN
  Administered 2020-01-29 (×6): 100 ug via INTRAVENOUS

## 2020-01-29 MED ORDER — LACTATED RINGERS IV SOLN
10.0000 mL/h | INTRAVENOUS | Status: DC
  Administered 2020-01-29: 10 mL/h via INTRAVENOUS

## 2020-01-29 MED ORDER — LIDOCAINE HCL (CARDIAC) PF 100 MG/5ML IV SOSY
PREFILLED_SYRINGE | INTRAVENOUS | Status: DC | PRN
  Administered 2020-01-29: 40 mg via INTRATRACHEAL

## 2020-01-29 MED ORDER — EPINEPHRINE PF 1 MG/ML IJ SOLN
INTRAMUSCULAR | Status: DC | PRN
  Administered 2020-01-29: 1 mg

## 2020-01-29 MED ORDER — GLYCOPYRROLATE 0.2 MG/ML IJ SOLN
INTRAMUSCULAR | Status: DC | PRN
  Administered 2020-01-29: .2 mg via INTRAVENOUS

## 2020-01-29 MED ORDER — POVIDONE-IODINE 7.5 % EX SOLN: Freq: Once | CUTANEOUS | Status: DC

## 2020-01-29 MED ORDER — OXYCODONE-ACETAMINOPHEN 5-325 MG PO TABS: 1.0000 | ORAL_TABLET | ORAL | 0 refills | Status: DC | PRN

## 2020-01-29 MED ORDER — ONDANSETRON HCL 4 MG/2ML IJ SOLN
INTRAMUSCULAR | Status: DC | PRN
  Administered 2020-01-29: 4 mg via INTRAVENOUS

## 2020-01-29 MED ORDER — ONDANSETRON HCL 4 MG/2ML IJ SOLN: 4.0000 mg | Freq: Four times a day (QID) | INTRAMUSCULAR | Status: DC | PRN

## 2020-01-29 MED ORDER — ACETAMINOPHEN 160 MG/5ML PO SOLN: 325.0000 mg | ORAL | Status: DC | PRN

## 2020-01-29 MED ORDER — OXYCODONE HCL 5 MG/5ML PO SOLN: 5.0000 mg | Freq: Once | ORAL | Status: DC | PRN

## 2020-01-29 MED ORDER — FENTANYL CITRATE (PF) 100 MCG/2ML IJ SOLN
INTRAMUSCULAR | Status: DC | PRN
  Administered 2020-01-29: 50 ug via INTRAVENOUS

## 2020-01-29 MED ORDER — CLINDAMYCIN PHOSPHATE 900 MG/50ML IV SOLN
900.0000 mg | INTRAVENOUS | Status: AC
  Administered 2020-01-29: 900 mg via INTRAVENOUS

## 2020-01-29 MED ORDER — OXYCODONE HCL 5 MG PO TABS: 5.0000 mg | ORAL_TABLET | Freq: Once | ORAL | Status: DC | PRN

## 2020-01-29 MED ORDER — ACETAMINOPHEN 325 MG PO TABS: 325.0000 mg | ORAL_TABLET | ORAL | Status: DC | PRN

## 2020-01-29 MED ORDER — PROPOFOL 10 MG/ML IV BOLUS
INTRAVENOUS | Status: DC | PRN
  Administered 2020-01-29: 150 mg via INTRAVENOUS

## 2020-01-29 MED ORDER — DEXTROSE 50 % IV SOLN
INTRAVENOUS | Status: DC | PRN
Start: 2020-01-29 — End: 2020-01-29
  Administered 2020-01-29: 25 mL via INTRAVENOUS

## 2020-01-29 MED ORDER — ONDANSETRON HCL 4 MG/2ML IJ SOLN
4.0000 mg | Freq: Once | INTRAMUSCULAR | Status: AC | PRN
  Administered 2020-01-29: 4 mg via INTRAVENOUS

## 2020-01-29 MED ORDER — DEXTROSE 50 % IV SOLN
25.0000 mL | Freq: Once | INTRAVENOUS | Status: AC
  Administered 2020-01-29: 25 mL via INTRAVENOUS

## 2020-01-29 MED ORDER — BUPIVACAINE HCL 0.25 % IJ SOLN
INTRAMUSCULAR | Status: DC | PRN
  Administered 2020-01-29: 10 mL

## 2020-01-29 MED ORDER — BUPIVACAINE LIPOSOME 1.3 % IJ SUSP
INTRAMUSCULAR | Status: DC | PRN
  Administered 2020-01-29: 10 mL

## 2020-01-29 MED ORDER — ONDANSETRON HCL 4 MG PO TABS: 4.0000 mg | ORAL_TABLET | Freq: Four times a day (QID) | ORAL | Status: DC | PRN

## 2020-01-29 MED ORDER — SCOPOLAMINE 1 MG/3DAYS TD PT72
1.0000 | MEDICATED_PATCH | Freq: Once | TRANSDERMAL | Status: DC
  Administered 2020-01-29: 1.5 mg via TRANSDERMAL

## 2020-01-29 MED ORDER — MIDAZOLAM HCL 5 MG/5ML IJ SOLN
INTRAMUSCULAR | Status: DC | PRN
  Administered 2020-01-29: 2 mg via INTRAVENOUS

## 2020-01-29 MED ORDER — BUPIVACAINE HCL (PF) 0.5 % IJ SOLN
INTRAMUSCULAR | Status: DC | PRN
  Administered 2020-01-29: 4 mL

## 2020-01-29 MED ORDER — FENTANYL CITRATE (PF) 100 MCG/2ML IJ SOLN: 25.0000 ug | INTRAMUSCULAR | Status: DC | PRN

## 2020-01-29 SURGICAL SUPPLY — 48 items
BENZOIN TINCTURE PRP APPL 2/3 (GAUZE/BANDAGES/DRESSINGS) ×3 IMPLANT
BLADE OSC/SAGITTAL MD 9X18.5 (BLADE) ×1 IMPLANT
BLADE OSCILLATING/SAGITTAL (BLADE) ×1
BLADE SURG 15 STRL LF DISP TIS (BLADE) IMPLANT
BLADE SURG 15 STRL SS (BLADE) ×1
BLADE SW THK.38XMED LNG THN (BLADE) IMPLANT
BNDG COHESIVE 4X5 TAN STRL (GAUZE/BANDAGES/DRESSINGS) ×3 IMPLANT
BNDG ELASTIC 4X5.8 VLCR STR LF (GAUZE/BANDAGES/DRESSINGS) ×3 IMPLANT
BNDG ESMARK 4X12 TAN STRL LF (GAUZE/BANDAGES/DRESSINGS) ×3 IMPLANT
BNDG GAUZE 4.5X4.1 6PLY STRL (MISCELLANEOUS) ×3 IMPLANT
BNDG STRETCH 4X75 STRL LF (GAUZE/BANDAGES/DRESSINGS) ×3 IMPLANT
CANISTER SUCT 1200ML W/VALVE (MISCELLANEOUS) ×3 IMPLANT
COVER LIGHT HANDLE UNIVERSAL (MISCELLANEOUS) ×6 IMPLANT
CUFF TOURN SGL QUICK 18X4 (TOURNIQUET CUFF) ×1 IMPLANT
DRAPE FLUOR MINI C-ARM 54X84 (DRAPES) ×3 IMPLANT
DURAPREP 26ML APPLICATOR (WOUND CARE) ×3 IMPLANT
ELECT REM PT RETURN 9FT ADLT (ELECTROSURGICAL) ×3
ELECTRODE REM PT RTRN 9FT ADLT (ELECTROSURGICAL) ×2 IMPLANT
GAUZE SPONGE 4X4 12PLY STRL (GAUZE/BANDAGES/DRESSINGS) ×3 IMPLANT
GAUZE XEROFORM 1X8 LF (GAUZE/BANDAGES/DRESSINGS) ×3 IMPLANT
GLOVE BIO SURGEON STRL SZ7.5 (GLOVE) ×3 IMPLANT
GLOVE INDICATOR 8.0 STRL GRN (GLOVE) ×3 IMPLANT
GOWN STRL REUS W/ TWL LRG LVL3 (GOWN DISPOSABLE) ×4 IMPLANT
GOWN STRL REUS W/TWL LRG LVL3 (GOWN DISPOSABLE) ×2
K-WIRE DBL END TROCAR 6X.045 (WIRE) ×3
K-WIRE DBL END TROCAR 6X.062 (WIRE) ×3
KIT PROCEDURE DRILL (DRILL) ×1 IMPLANT
KIT TURNOVER KIT A (KITS) ×3 IMPLANT
KWIRE DBL END TROCAR 6X.045 (WIRE) IMPLANT
KWIRE DBL END TROCAR 6X.062 (WIRE) IMPLANT
NDL HYPO 25GX1 SAFETY (NEEDLE) ×6 IMPLANT
NEEDLE HYPO 25GX1 SAFETY (NEEDLE) ×6 IMPLANT
NS IRRIG 500ML POUR BTL (IV SOLUTION) ×3 IMPLANT
PACK EXTREMITY ARMC (MISCELLANEOUS) ×3 IMPLANT
PENCIL SMOKE EVACUATOR (MISCELLANEOUS) ×3 IMPLANT
RASP SM TEAR CROSS CUT (RASP) ×1 IMPLANT
STAPLE DYNACLIP 8X8 (Staple) ×1 IMPLANT
STOCKINETTE IMPERVIOUS LG (DRAPES) ×3 IMPLANT
STRIP CLOSURE SKIN 1/4X4 (GAUZE/BANDAGES/DRESSINGS) ×3 IMPLANT
SUT ETHILON 3-0 FS-10 30 BLK (SUTURE) ×3
SUT MNCRL+ 5-0 UNDYED PC-3 (SUTURE) IMPLANT
SUT MONOCRYL 5-0 (SUTURE) ×1
SUT VIC AB 3-0 SH 27 (SUTURE) ×1
SUT VIC AB 3-0 SH 27X BRD (SUTURE) IMPLANT
SUT VIC AB 4-0 FS2 27 (SUTURE) ×3 IMPLANT
SUTURE EHLN 3-0 FS-10 30 BLK (SUTURE) IMPLANT
SWABSTK COMLB BENZOIN TINCTURE (MISCELLANEOUS) ×3 IMPLANT
SYR 10ML LL (SYRINGE) ×3 IMPLANT

## 2020-01-29 NOTE — Transfer of Care (Signed)
Immediate Anesthesia Transfer of Care Note  Patient: Toni Mcconnell  Procedure(s) Performed: DOUBLE OSTECTOMY RIGHT (Right Foot) MINOR SCREW;DEEP, LEFT (Left Foot)  Patient Location: PACU  Anesthesia Type: General LMA  Level of Consciousness: awake, alert  and patient cooperative  Airway and Oxygen Therapy: Patient Spontanous Breathing and Patient connected to supplemental oxygen  Post-op Assessment: Post-op Vital signs reviewed, Patient's Cardiovascular Status Stable, Respiratory Function Stable, Patent Airway and No signs of Nausea or vomiting  Post-op Vital Signs: Reviewed and stable  Complications: No apparent anesthesia complications

## 2020-01-29 NOTE — Anesthesia Preprocedure Evaluation (Signed)
Anesthesia Evaluation  Patient identified by MRN, date of birth, ID band Patient awake    Reviewed: Allergy & Precautions, H&P , NPO status , Patient's Chart, lab work & pertinent test results  Airway Mallampati: III  TM Distance: >3 FB Neck ROM: full    Dental no notable dental hx.    Pulmonary    Pulmonary exam normal breath sounds clear to auscultation       Cardiovascular hypertension, Normal cardiovascular exam Rhythm:regular Rate:Normal     Neuro/Psych    GI/Hepatic   Endo/Other  diabetes, Insulin Dependent, Oral Hypoglycemic Agents  Renal/GU      Musculoskeletal   Abdominal   Peds  Hematology   Anesthesia Other Findings   Reproductive/Obstetrics                             Anesthesia Physical Anesthesia Plan  ASA: II  Anesthesia Plan: General LMA   Post-op Pain Management:    Induction:   PONV Risk Score and Plan: 3 and Treatment may vary due to age or medical condition, Ondansetron, Dexamethasone, Midazolam and Scopolamine patch - Pre-op  Airway Management Planned:   Additional Equipment:   Intra-op Plan:   Post-operative Plan:   Informed Consent: I have reviewed the patients History and Physical, chart, labs and discussed the procedure including the risks, benefits and alternatives for the proposed anesthesia with the patient or authorized representative who has indicated his/her understanding and acceptance.     Dental Advisory Given  Plan Discussed with: CRNA  Anesthesia Plan Comments:         Anesthesia Quick Evaluation

## 2020-01-29 NOTE — Anesthesia Procedure Notes (Signed)
Procedure Name: LMA Insertion Date/Time: 01/29/2020 12:09 PM Performed by: Silvana Newness, CRNA Pre-anesthesia Checklist: Patient identified, Emergency Drugs available, Suction available, Patient being monitored and Timeout performed Patient Re-evaluated:Patient Re-evaluated prior to induction Oxygen Delivery Method: Circle system utilized Preoxygenation: Pre-oxygenation with 100% oxygen Induction Type: IV induction Ventilation: Mask ventilation without difficulty LMA: LMA inserted LMA Size: 4.0 Number of attempts: 1 Placement Confirmation: positive ETCO2 and breath sounds checked- equal and bilateral Tube secured with: Tape Dental Injury: Teeth and Oropharynx as per pre-operative assessment

## 2020-01-29 NOTE — Anesthesia Postprocedure Evaluation (Signed)
Anesthesia Post Note  Patient: Toni Mcconnell  Procedure(s) Performed: DOUBLE OSTECTOMY RIGHT (Right Foot) MINOR SCREW;DEEP, LEFT (Left Foot)     Patient location during evaluation: PACU Anesthesia Type: General Level of consciousness: awake and alert and oriented Pain management: satisfactory to patient Vital Signs Assessment: post-procedure vital signs reviewed and stable Respiratory status: spontaneous breathing, nonlabored ventilation and respiratory function stable Cardiovascular status: blood pressure returned to baseline and stable Postop Assessment: Adequate PO intake and No signs of nausea or vomiting Anesthetic complications: no    Raliegh Ip

## 2020-01-29 NOTE — Op Note (Signed)
Operative note   Surgeon:Kentrell Hallahan Lawyer: None    Preop diagnosis: 1.  Hallux valgus deformity right foot 2.  Painful retained K wire left foot    Postop diagnosis: Same    Procedure: 1.  [place] with Akin hallux valgus correction right foot. 2.  Removal retained K wire left first metatarsal deep    EBL: Minimal    Anesthesia:local and general    Hemostasis: Ankle tourniquet inflated to 200 mmHg for approximately 40 minutes    Specimen: None    Complications: None    Operative indications:Toni Mcconnell is an 59 y.o. that presents today for surgical intervention.  The risks/benefits/alternatives/complications have been discussed and consent has been given.    Procedure:  Patient was brought into the OR and placed on the operating table in the supine position. After anesthesia was obtained the right and left lower extremity was prepped and draped in usual sterile fashion.  Attention was directed to the right foot initially.  After inflation of the tourniquet a dorsomedial incision was performed.  Sharp and blunt dissection carried down to the capsule.  A T capsulotomy was performed.  The medial eminence was noted and transected with a power saw.  Next a V osteotomy was created.  The capital fragment was translocated laterally.  This was then stabilized with a dorsal distal to proximal plantar compression headless screw.  Good compression and stability was noted with good realignment of the MTPJ.  The ensuing overhanging ledge was transected with a power saw and smoothed with a power rasp.  At this time mild residual valgus of the great toe was noted distal to the MTPJ.  At this time a decision to perform an Akin osteotomy was decided.  The incision was carried distal to the interphalangeal joint.  Sharp and blunt dissection carried down to the periosteum.  Next a small wedge of bone was taken out of the midshaft of the proximal phalanx with the apex laterally.  This was  then closed down and compressed with the lateral hinge left intact.  This was then stabilized with a compression bone staple.  Good realignment was noted in all planes.  The wound was flushed with copious amounts of irrigation.  The capsule was closed with a 3-0 Vicryl.  A small capsulorrhaphy was performed medially.  The subcutaneous tissue was closed with a 4-0 Vicryl and the skin closed with a 5-0 Monocryl.  The tourniquet was deflated  Attention was then directed to the left foot and a small 2 cm incision was made dorsal medial overlying the prominent K wire.  Sharp and blunt dissection carried down to the periosteum.  Subperiosteal dissection was then undertaken.  The K wire was noted at this time.  This was then removed from the surgical field in toto.  The wound was flushed with copious amounts of irrigation.  Closure was then performed with a 3-0 nylon.  Pre and post surgical fluoroscopy was used as well as intraoperative fluoroscopy throughout the procedure for the osteotomies, placement of hardware, and K wire removal.  A bulky sterile dressing was then applied to bilateral feet.    Patient tolerated the procedure and anesthesia well.  Was transported from the OR to the PACU with all vital signs stable and vascular status intact. To be discharged per routine protocol.  Will follow up in approximately 1 week in the outpatient clinic.

## 2020-01-29 NOTE — H&P (Signed)
HISTORY AND PHYSICAL INTERVAL NOTE:  01/29/2020  11:51 AM  Toni Mcconnell  has presented today for surgery, with the diagnosis of E11.9 TYPE 2 DIABETES M20.11 HALLUX VALGUS RIGHT FOOT T85.848A PAIN FROM IMPLANTED HARDWARE.  The various methods of treatment have been discussed with the patient.  No guarantees were given.  After consideration of risks, benefits and other options for treatment, the patient has consented to surgery.  I have reviewed the patients' chart and labs.     A history and physical examination was performed in my office.  The patient was reexamined.  There have been no changes to this history and physical examination.  Toni Mcconnell A

## 2020-01-30 ENCOUNTER — Encounter: Payer: Self-pay | Admitting: *Deleted

## 2020-04-29 ENCOUNTER — Other Ambulatory Visit: Payer: Self-pay | Admitting: Internal Medicine

## 2020-04-29 DIAGNOSIS — Z1231 Encounter for screening mammogram for malignant neoplasm of breast: Secondary | ICD-10-CM

## 2020-05-03 ENCOUNTER — Ambulatory Visit
Admission: EM | Admit: 2020-05-03 | Discharge: 2020-05-03 | Disposition: A | Discharge status: Home / Self Care | Payer: Federal, State, Local not specified - PPO | Attending: Emergency Medicine | Admitting: Emergency Medicine

## 2020-05-03 ENCOUNTER — Other Ambulatory Visit: Payer: Self-pay

## 2020-05-03 DIAGNOSIS — K219 Gastro-esophageal reflux disease without esophagitis: Secondary | ICD-10-CM | POA: Insufficient documentation

## 2020-05-03 DIAGNOSIS — Z7901 Long term (current) use of anticoagulants: Secondary | ICD-10-CM | POA: Insufficient documentation

## 2020-05-03 DIAGNOSIS — Z7982 Long term (current) use of aspirin: Secondary | ICD-10-CM | POA: Diagnosis not present

## 2020-05-03 DIAGNOSIS — R05 Cough: Secondary | ICD-10-CM | POA: Diagnosis not present

## 2020-05-03 DIAGNOSIS — M1990 Unspecified osteoarthritis, unspecified site: Secondary | ICD-10-CM | POA: Insufficient documentation

## 2020-05-03 DIAGNOSIS — R059 Cough, unspecified: Secondary | ICD-10-CM

## 2020-05-03 DIAGNOSIS — R0981 Nasal congestion: Secondary | ICD-10-CM | POA: Insufficient documentation

## 2020-05-03 DIAGNOSIS — E119 Type 2 diabetes mellitus without complications: Secondary | ICD-10-CM | POA: Insufficient documentation

## 2020-05-03 DIAGNOSIS — Z794 Long term (current) use of insulin: Secondary | ICD-10-CM | POA: Diagnosis not present

## 2020-05-03 DIAGNOSIS — R0982 Postnasal drip: Secondary | ICD-10-CM | POA: Diagnosis not present

## 2020-05-03 DIAGNOSIS — Z20822 Contact with and (suspected) exposure to covid-19: Secondary | ICD-10-CM | POA: Insufficient documentation

## 2020-05-03 DIAGNOSIS — E7800 Pure hypercholesterolemia, unspecified: Secondary | ICD-10-CM | POA: Diagnosis not present

## 2020-05-03 DIAGNOSIS — I10 Essential (primary) hypertension: Secondary | ICD-10-CM | POA: Diagnosis not present

## 2020-05-03 MED ORDER — FLUTICASONE PROPIONATE 50 MCG/ACT NA SUSP: 2.0000 | Freq: Every day | NASAL | 0 refills | Status: AC

## 2020-05-03 MED ORDER — HYDROCOD POLST-CPM POLST ER 10-8 MG/5ML PO SUER: 5.0000 mL | Freq: Two times a day (BID) | ORAL | 0 refills | Status: AC | PRN

## 2020-05-03 MED ORDER — BENZONATATE 200 MG PO CAPS: 200.0000 mg | ORAL_CAPSULE | Freq: Three times a day (TID) | ORAL | 0 refills | Status: AC | PRN

## 2020-05-03 NOTE — Discharge Instructions (Addendum)
Saline nasal irrigation with a Milta Deiters med rinse and distilled water as often as you want, Flonase.  Tessalon for the cough during the day, Tussionex for the cough at night.  We will contact you only if your Covid test is positive.  This cough could also be caused by acid reflux or by your lisinopril as well.  Follow-up with your doctor if you do not get better with this initial round of medications.

## 2020-05-03 NOTE — ED Triage Notes (Signed)
Patient states that she has been coughing since Thursday and states that over the last day she feels like her cough has worsened.

## 2020-05-03 NOTE — ED Provider Notes (Signed)
HPI  SUBJECTIVE:  Toni Mcconnell is a 59 y.o. female who presents with 4 days of nonproductive dry cough, nasal congestion, postnasal drip.  States that she is unable to sleep at night secondary to the cough.  No fevers, body aches, headaches, sinus pain or pressure, loss of sense of taste or smell.  No wheezing, chest pain, shortness of breath.  No nausea, vomiting, diarrhea, abdominal pain.  No known Covid exposure.  She got both doses of the Moderna vaccine in April 21.  No GERD symptoms.  No facial swelling, upper dental pain.  No antibiotics in the past month.  No antipyretic in the past 6 hours.  She tried Robitussin-DM without improvement in her symptoms.  Her cough is worse at night.  She states that she feels as if her throat is tickling followed by the urge to cough.  No calf pain, swelling, surgery in the past 4 weeks, recent immobilization, hemoptysis.  She has had a cough like this before states that usually resolves with cough syrup in about 2 days.  She has a past medical history of diabetes, states that her glucose has been running within normal limits.  She has a history of hypertension on an ACE inhibitor.  She also has GERD.  No history of allergies, pulmonary disease, smoking, PE, DVT, cancer, hypercoagulability, frequent sinusitis.  PMD: Tommie Sams, MD  Past Medical History:  Diagnosis Date  . Arthritis    left hip  . Diabetes mellitus without complication (HCC)    Type 2  . Hypercholesteremia   . Hypertension     Past Surgical History:  Procedure Laterality Date  . ABDOMINAL HYSTERECTOMY    . BREAST BIOPSY Left 12/2014   u/s bx/clip-neg  . BREAST BIOPSY Right 2013   bx/clip-neg  . MINOR HARDWARE REMOVAL Left 01/29/2020   Procedure: MINOR SCREW;DEEP, LEFT;  Surgeon: Samara Deist, DPM;  Location: [facility];  Service: Podiatry;  Laterality: Left;  . OSTECTOMY Right 01/29/2020   Procedure: DOUBLE OSTECTOMY RIGHT;  Surgeon: Samara Deist,  DPM;  Location: [facility];  Service: Podiatry;  Laterality: Right;  GENERAL W/LOCAL  . PANCREAS SURGERY     removed benign tumor    Family History  Problem Relation Age of Onset  . Diabetes Mother   . Hypertension Mother   . Diabetes Father   . COPD Father   . Breast cancer Neg Hx     Social History   Tobacco Use  . Smoking status: Never Smoker  . Smokeless tobacco: Never Used  Vaping Use  . Vaping Use: Never used  Substance Use Topics  . Alcohol use: No  . Drug use: No    No current facility-administered medications for this encounter.  Current Outpatient Medications:  .  cyclobenzaprine (FLEXERIL) 10 MG tablet, Take 1 tablet (10 mg total) by mouth 2 (two) times daily as needed for muscle spasms. Do not drive while taking as can cause drowsiness, Disp: 15 tablet, Rfl: 0 .  insulin aspart (NOVOLOG) 100 UNIT/ML injection, Inject 10 Units into the skin 3 (three) times daily before meals., Disp: , Rfl:  .  Insulin Glargine (TOUJEO MAX SOLOSTAR) 300 UNIT/ML SOPN, Inject 30 Units into the skin daily. evening, Disp: , Rfl:  .  lisinopril-hydrochlorothiazide (ZESTORETIC) 20-12.5 MG tablet, Take 1 tablet by mouth daily., Disp: , Rfl:  .  Multiple Vitamin (MULTIVITAMIN) tablet, Take 1 tablet by mouth daily., Disp: , Rfl:  .  SYNJARDY XR 12.01-999 MG TB24, Take  2 tablets by mouth daily., Disp: , Rfl:  .  TRULICITY 7.61 PJ/0.9TO SOPN, SMARTSIG:0.5 Milliliter(s) SUB-Q Once a Week, Disp: , Rfl:  .  amLODipine (NORVASC) 5 MG tablet, Take 5 mg by mouth daily., Disp: , Rfl:  .  amLODipine-atorvastatin (CADUET) 10-40 MG per tablet, Take 1 tablet by mouth daily., Disp: , Rfl:  .  aspirin 81 MG tablet, Take 81 mg by mouth daily., Disp: , Rfl:  .  atorvastatin (LIPITOR) 40 MG tablet, Take 40 mg by mouth daily., Disp: , Rfl:  .  benzonatate (TESSALON) 200 MG capsule, Take 1 capsule (200 mg total) by mouth 3 (three) times daily as needed for cough., Disp: 30 capsule, Rfl: 0 .  Cetirizine  HCl 10 MG CAPS, Take 1 capsule (10 mg total) by mouth daily for 10 days., Disp: 15 capsule, Rfl: 0 .  chlorpheniramine-HYDROcodone (TUSSIONEX PENNKINETIC ER) 10-8 MG/5ML SUER, Take 5 mLs by mouth every 12 (twelve) hours as needed for cough., Disp: 60 mL, Rfl: 0 .  fluticasone (FLONASE) 50 MCG/ACT nasal spray, Place 2 sprays into both nostrils daily., Disp: 16 mL, Rfl: 0 .  metFORMIN (GLUCOPHAGE) 500 MG tablet, Take 2,000 mg by mouth daily with breakfast., Disp: , Rfl:  .  ranitidine (ZANTAC) 150 MG tablet, Take 150 mg by mouth 2 (two) times daily., Disp: , Rfl:   Allergies  Allergen Reactions  . Penicillins Other (See Comments)    Ulcers in the eyes     ROS  As noted in HPI.   Physical Exam  BP 135/89 (BP Location: Left Arm)   Pulse 92   Temp 98.7 F (37.1 C) (Oral)   Resp 18   Ht 5\' 6"  (1.676 m)   Wt 79.4 kg   SpO2 98%   BMI 28.25 kg/m   Constitutional: Well developed, well nourished, no acute distress Eyes:  EOMI, conjunctiva normal bilaterally HENT: Normocephalic, atraumatic,mucus membranes moist.  Erythematous, swollen turbinates.  Clear nasal congestion.  No maxillary, frontal sinus tenderness.  Unable to completely visualize oropharynx. Respiratory: Normal inspiratory effort, lungs clear bilaterally, good air movement.  Positive lateral chest wall tenderness Cardiovascular: Normal rate regular rhythm no murmurs rubs or gallops GI: nondistended skin: No rash, skin intact Musculoskeletal: Calves symmetric, nontender, no edema Neurologic: Alert & oriented x 3, no focal neuro deficits Psychiatric: Speech and behavior appropriate   ED Course   Medications - No data to display  Orders Placed This Encounter  Procedures  . SARS CORONAVIRUS 2 (TAT 6-24 HRS) Nasopharyngeal Nasopharyngeal Swab    Standing Status:   Standing    Number of Occurrences:   1    Order Specific Question:   Is this test for diagnosis or screening    Answer:   Diagnosis of ill patient    Order  Specific Question:   Symptomatic for COVID-19 as defined by CDC    Answer:   Yes    Order Specific Question:   Date of Symptom Onset    Answer:   04/30/2020    Order Specific Question:   Hospitalized for COVID-19    Answer:   No    Order Specific Question:   Admitted to ICU for COVID-19    Answer:   No    Order Specific Question:   Previously tested for COVID-19    Answer:   Yes    Order Specific Question:   Resident in a congregate (group) care setting    Answer:   No    Order  Specific Question:   Employed in healthcare setting    Answer:   No    Order Specific Question:   Pregnant    Answer:   No    Order Specific Question:   Has patient completed COVID vaccination(s) (2 doses of Pfizer/Moderna 1 dose of [employer])    Answer:   Yes    No results found for this or any previous visit (from the past 24 hour(s)). No results found.  ED Clinical Impression  1. Cough      ED Assessment/Plan  [place] Narcotic database reviewed for this patient, and feel that the risk/benefit ratio today is favorable for proceeding with a prescription for controlled substance.  Last prescribed 5 days of Percocet in May 21.  No other opiate prescriptions.  Suspect postnasal drip causing the cough.  Also in the differential is acid reflux and lisinopril.  She has no sinus tenderness suggestive of sinusitis.  She has no lung findings suggestive of pulmonary disease or infection.  She has no fever, satting well room air, is not tachycardic has no risk factors for PE, doubt PE.  Doubt malignancy or congestive heart failure in the absence of any other symptoms other than cough.  Discussed this with patient.  Will try saline nasal irrigation, Flonase, Tessalon, Tussionex.  Covid PCR sent.  If this initial round of medication does not work she will follow-up with her doctor to consider acid reflux or her lisinopril causing her cough.  Covid negative  Discussed MDM, treatment plan, and plan for follow-up with  patient. . patient agrees with plan.   Meds ordered this encounter  Medications  . fluticasone (FLONASE) 50 MCG/ACT nasal spray    Sig: Place 2 sprays into both nostrils daily.    Dispense:  16 mL    Refill:  0  . chlorpheniramine-HYDROcodone (TUSSIONEX PENNKINETIC ER) 10-8 MG/5ML SUER    Sig: Take 5 mLs by mouth every 12 (twelve) hours as needed for cough.    Dispense:  60 mL    Refill:  0  . benzonatate (TESSALON) 200 MG capsule    Sig: Take 1 capsule (200 mg total) by mouth 3 (three) times daily as needed for cough.    Dispense:  30 capsule    Refill:  0    *This clinic note was created using [employer]. Therefore, there may be occasional mistakes despite careful proofreading.   ?    Melynda Ripple, MD 05/05/20 [phone]

## 2020-05-04 LAB — SARS CORONAVIRUS 2 (TAT 6-24 HRS): SARS Coronavirus 2: NEGATIVE

## 2020-06-10 ENCOUNTER — Ambulatory Visit: Payer: Federal, State, Local not specified - PPO

## 2020-07-21 ENCOUNTER — Ambulatory Visit
Admission: RE | Admit: 2020-07-21 | Discharge: 2020-07-21 | Disposition: A | Discharge status: Home / Self Care | Payer: Federal, State, Local not specified - PPO | Source: Ambulatory Visit | Attending: Internal Medicine | Admitting: Internal Medicine

## 2020-07-21 ENCOUNTER — Other Ambulatory Visit: Payer: Self-pay

## 2020-07-21 DIAGNOSIS — Z1231 Encounter for screening mammogram for malignant neoplasm of breast: Secondary | ICD-10-CM | POA: Insufficient documentation

## 2020-07-23 ENCOUNTER — Other Ambulatory Visit: Payer: Self-pay | Admitting: Internal Medicine

## 2020-08-03 ENCOUNTER — Other Ambulatory Visit: Payer: Self-pay | Admitting: Internal Medicine

## 2020-08-03 DIAGNOSIS — R928 Other abnormal and inconclusive findings on diagnostic imaging of breast: Secondary | ICD-10-CM

## 2020-08-03 DIAGNOSIS — R921 Mammographic calcification found on diagnostic imaging of breast: Secondary | ICD-10-CM

## 2020-08-03 DIAGNOSIS — N6489 Other specified disorders of breast: Secondary | ICD-10-CM

## 2020-08-05 ENCOUNTER — Ambulatory Visit
Admission: RE | Admit: 2020-08-05 | Discharge: 2020-08-05 | Disposition: A | Discharge status: Home / Self Care | Payer: Federal, State, Local not specified - PPO | Source: Ambulatory Visit | Attending: Internal Medicine | Admitting: Internal Medicine

## 2020-08-05 ENCOUNTER — Other Ambulatory Visit: Payer: Self-pay

## 2020-08-05 DIAGNOSIS — R921 Mammographic calcification found on diagnostic imaging of breast: Secondary | ICD-10-CM

## 2020-08-05 DIAGNOSIS — R928 Other abnormal and inconclusive findings on diagnostic imaging of breast: Secondary | ICD-10-CM | POA: Insufficient documentation

## 2020-08-05 DIAGNOSIS — N6489 Other specified disorders of breast: Secondary | ICD-10-CM | POA: Diagnosis present

## 2020-12-15 ENCOUNTER — Ambulatory Visit
Admission: EM | Admit: 2020-12-15 | Discharge: 2020-12-15 | Disposition: A | Discharge status: Home / Self Care | Payer: Federal, State, Local not specified - PPO | Attending: Sports Medicine | Admitting: Sports Medicine

## 2020-12-15 ENCOUNTER — Other Ambulatory Visit: Payer: Self-pay

## 2020-12-15 ENCOUNTER — Encounter: Payer: Self-pay | Admitting: Emergency Medicine

## 2020-12-15 DIAGNOSIS — H6123 Impacted cerumen, bilateral: Secondary | ICD-10-CM | POA: Diagnosis not present

## 2020-12-15 DIAGNOSIS — R42 Dizziness and giddiness: Secondary | ICD-10-CM

## 2020-12-15 DIAGNOSIS — H8112 Benign paroxysmal vertigo, left ear: Secondary | ICD-10-CM

## 2020-12-15 DIAGNOSIS — R110 Nausea: Secondary | ICD-10-CM

## 2020-12-15 DIAGNOSIS — R519 Headache, unspecified: Secondary | ICD-10-CM

## 2020-12-15 MED ORDER — ONDANSETRON HCL 4 MG PO TABS: 4.0000 mg | ORAL_TABLET | Freq: Four times a day (QID) | ORAL | 0 refills | Status: AC

## 2020-12-15 MED ORDER — MECLIZINE HCL 25 MG PO TABS: 25.0000 mg | ORAL_TABLET | Freq: Three times a day (TID) | ORAL | 0 refills | Status: AC | PRN

## 2020-12-15 NOTE — ED Triage Notes (Signed)
Patient states she had an episode of vertigo last year and states she feels this has returned. She states she had an episode yesterday and again this morning.

## 2020-12-15 NOTE — ED Provider Notes (Signed)
[facility]    CSN: [id number] Arrival date & time: 12/15/20  7322      History   Chief Complaint Chief Complaint  Patient presents with  . Dizziness    HPI Toni Mcconnell is a 60 y.o. female.   Patient pleasant 60 year old female who presents for evaluation of acute onset of dizziness and vertigo.  On further history appears as though she started having some left ear pressure and pain with left-sided facial pain.  It began yesterday morning.  This morning around 5 AM she woke up and the room was spinning.  She sat up in the bed and then stood afterwards and her symptoms resolved.  She denies any fever shakes chills.  She does have nausea but no diarrhea or vomiting.  She has had no COVID exposure or COVID history.  She been vaccinated x3 with the booster.  She is also gotten her flu shot.  She denies any facial asymmetry or difficulty swallowing.  Normally sees [facility] in [place] but could not get in to see them today.  She is retired and stays at home.  She has no history of seasonal allergies.  Denies any vision changes jaw pain diaphoresis shortness of breath or chest pain.  No abdominal or urinary symptoms other than the nausea.  No red flag signs or symptoms elicited on history.     Past Medical History:  Diagnosis Date  . Arthritis    left hip  . Diabetes mellitus without complication (HCC)    Type 2  . Hypercholesteremia   . Hypertension     There are no problems to display for this patient.   Past Surgical History:  Procedure Laterality Date  . ABDOMINAL HYSTERECTOMY    . BREAST BIOPSY Left 12/2014   u/s bx/clip-neg  . BREAST BIOPSY Right 2013   bx/clip-neg  . MINOR HARDWARE REMOVAL Left 01/29/2020   Procedure: MINOR SCREW;DEEP, LEFT;  Surgeon: Samara Deist, DPM;  Location: [place];  Service: Podiatry;  Laterality: Left;  . OSTECTOMY Right 01/29/2020   Procedure: DOUBLE OSTECTOMY RIGHT;  Surgeon: Samara Deist,  DPM;  Location: [place];  Service: Podiatry;  Laterality: Right;  GENERAL W/LOCAL  . PANCREAS SURGERY     removed benign tumor    OB History   No obstetric history on file.      Home Medications    Prior to Admission medications   Medication Sig Start Date End Date Taking? Authorizing Provider  amLODipine (NORVASC) 5 MG tablet Take 5 mg by mouth daily. 02/14/20  Yes [provider]  insulin aspart (NOVOLOG) 100 UNIT/ML injection Inject 10 Units into the skin 3 (three) times daily before meals.   Yes [provider]  Insulin Glargine 300 UNIT/ML SOPN Inject 30 Units into the skin daily. evening   Yes [provider]  losartan-hydrochlorothiazide (HYZAAR) 50-12.5 MG tablet Take 1 tablet by mouth daily. 05/21/20 05/21/21 Yes [provider]  meclizine (ANTIVERT) 25 MG tablet Take 1 tablet (25 mg total) by mouth 3 (three) times daily as needed for dizziness. 12/15/20  Yes Verda Cumins, MD  Multiple Vitamin (MULTIVITAMIN) tablet Take 1 tablet by mouth daily.   Yes [provider]  ondansetron (ZOFRAN) 4 MG tablet Take 1 tablet (4 mg total) by mouth every 6 (six) hours. 12/15/20  Yes Verda Cumins, MD  SYNJARDY XR 12.01-999 MG TB24 Take 2 tablets by mouth daily. 04/17/20  Yes [provider]  TRULICITY 0.25  MG/0.5ML SOPN SMARTSIG:0.5 Milliliter(s) SUB-Q Once a Week 04/17/20  Yes [provider]  amLODipine-atorvastatin (CADUET) 10-40 MG per tablet Take 1 tablet by mouth daily.    [provider]  aspirin 81 MG tablet Take 81 mg by mouth daily.    [provider]  atorvastatin (LIPITOR) 40 MG tablet Take 40 mg by mouth daily. 02/14/20   [provider]  benzonatate (TESSALON) 200 MG capsule Take 1 capsule (200 mg total) by mouth 3 (three) times daily as needed for cough. 05/03/20   Melynda Ripple, MD  Cetirizine HCl 10 MG CAPS Take 1 capsule (10 mg total) by mouth daily for 10 days. 05/03/18 01/29/20   Wieters, Hallie C, PA-C  chlorpheniramine-HYDROcodone (TUSSIONEX PENNKINETIC ER) 10-8 MG/5ML SUER Take 5 mLs by mouth every 12 (twelve) hours as needed for cough. 05/03/20   Melynda Ripple, MD  cyclobenzaprine (FLEXERIL) 10 MG tablet Take 1 tablet (10 mg total) by mouth 2 (two) times daily as needed for muscle spasms. Do not drive while taking as can cause drowsiness 06/28/17   Marylene Land, NP  fluticasone [facility]) 50 MCG/ACT nasal spray Place 2 sprays into both nostrils daily. 05/03/20   Melynda Ripple, MD  lisinopril-hydrochlorothiazide (ZESTORETIC) 20-12.5 MG tablet Take 1 tablet by mouth daily. 02/14/20   [provider]  metFORMIN (GLUCOPHAGE) 500 MG tablet Take 2,000 mg by mouth daily with breakfast.    [provider]  ranitidine (ZANTAC) 150 MG tablet Take 150 mg by mouth 2 (two) times daily.    [provider]  cloNIDine (CATAPRES) 0.1 MG tablet Take 0.1 mg by mouth as needed.  05/03/20  [provider]  lisinopril (PRINIVIL,ZESTRIL) 20 MG tablet Take 20 mg by mouth daily.  05/03/20  [provider]  sitaGLIPtin (JANUVIA) 100 MG tablet Take 100 mg by mouth daily. am  05/03/20  [provider]    Family History Family History  Problem Relation Age of Onset  . Diabetes Mother   . Hypertension Mother   . Diabetes Father   . COPD Father   . Breast cancer Neg Hx     Social History Social History   Tobacco Use  . Smoking status: Never Smoker  . Smokeless tobacco: Never Used  Vaping Use  . Vaping Use: Never used  Substance Use Topics  . Alcohol use: No  . Drug use: No     Allergies   Penicillins   Review of Systems Review of Systems  Constitutional: Negative for activity change, appetite change, chills, diaphoresis, fatigue and fever.  HENT: Positive for ear pain. Negative for congestion, ear discharge, facial swelling, hearing loss, postnasal drip, rhinorrhea, sinus pressure, sinus pain, sneezing, sore throat and  tinnitus.   Eyes: Negative.  Negative for photophobia, pain, discharge, redness, itching and visual disturbance.  Respiratory: Negative.  Negative for apnea, cough, chest tightness, shortness of breath, wheezing and stridor.   Cardiovascular: Negative.  Negative for chest pain and palpitations.  Gastrointestinal: Negative.  Negative for abdominal pain, constipation, diarrhea, nausea and vomiting.  Genitourinary: Negative.  Negative for dysuria, frequency, pelvic pain and urgency.  Musculoskeletal: Negative.  Negative for arthralgias, back pain, myalgias, neck pain and neck stiffness.  Skin: Negative.  Negative for color change, pallor, rash and wound.  Neurological: Positive for dizziness, light-headedness and headaches. Negative for tremors, seizures, syncope, facial asymmetry, speech difficulty, weakness and numbness.  All other systems reviewed and are negative.    Physical Exam Triage Vital Signs ED Triage Vitals  Enc  Vitals Group     BP 12/15/20 0853 133/85     Pulse Rate 12/15/20 0853 77     Resp 12/15/20 0853 18     Temp 12/15/20 0853 98.2 F (36.8 C)     Temp Source 12/15/20 0853 Oral     SpO2 12/15/20 0853 98 %     Weight 12/15/20 0850 175 lb 0.7 oz (79.4 kg)     Height 12/15/20 0850 5\' 6"  (1.676 m)     Head Circumference --      Peak Flow --      Pain Score 12/15/20 0850 0     Pain Loc --      Pain Edu? --      Excl. in [place]? --    No data found.  Updated Vital Signs BP 133/85 (BP Location: Left Arm)   Pulse 77   Temp 98.2 F (36.8 C) (Oral)   Resp 18   Ht 5\' 6"  (1.676 m)   Wt 79.4 kg   SpO2 98%   BMI 28.25 kg/m   Visual Acuity Right Eye Distance:   Left Eye Distance:   Bilateral Distance:    Right Eye Near:   Left Eye Near:    Bilateral Near:     Physical Exam Vitals and nursing note reviewed.  Constitutional:      General: She is not in acute distress.    Appearance: Normal appearance. She is not ill-appearing, toxic-appearing or diaphoretic.   HENT:     Head: Normocephalic and atraumatic.     Right Ear: There is impacted cerumen.     Left Ear: There is impacted cerumen.     Nose: Nose normal. No congestion or rhinorrhea.     Mouth/Throat:     Mouth: Mucous membranes are moist.     Pharynx: Oropharynx is clear. No oropharyngeal exudate or posterior oropharyngeal erythema.  Eyes:     General: No scleral icterus.       Right eye: No discharge.        Left eye: No discharge.     Extraocular Movements: Extraocular movements intact.     Conjunctiva/sclera: Conjunctivae normal.     Pupils: Pupils are equal, round, and reactive to light.  Cardiovascular:     Rate and Rhythm: Normal rate and regular rhythm.     Pulses: Normal pulses.     Heart sounds: Normal heart sounds. No murmur heard. No friction rub. No gallop.   Pulmonary:     Effort: Pulmonary effort is normal. No respiratory distress.     Breath sounds: Normal breath sounds. No stridor. No wheezing, rhonchi or rales.  Musculoskeletal:     Cervical back: Normal range of motion and neck supple. No rigidity or tenderness.  Lymphadenopathy:     Cervical: No cervical adenopathy.  Skin:    Capillary Refill: Capillary refill takes less than 2 seconds.  Neurological:     General: No focal deficit present.     Mental Status: She is alert and oriented to person, place, and time.     GCS: GCS eye subscore is 4. GCS verbal subscore is 5. GCS motor subscore is 6.     Cranial Nerves: No cranial nerve deficit.     Sensory: No sensory deficit.     Motor: No weakness, tremor or seizure activity.     Coordination: Coordination is intact. Romberg sign negative. Coordination normal. Finger-Nose-Finger Test and Heel to [facility] Test normal.     Gait: Gait normal.  Deep Tendon Reflexes: Reflexes are normal and symmetric. Reflexes normal.     Reflex Scores:      Tricep reflexes are 2+ on the right side and 2+ on the left side.      Bicep reflexes are 2+ on the right side and 2+ on the  left side.      Brachioradialis reflexes are 2+ on the right side and 2+ on the left side.      Patellar reflexes are 2+ on the right side and 2+ on the left side.      Achilles reflexes are 2+ on the right side and 2+ on the left side.    Comments: No evidence of Bell's palsy or stroke on examination.  Full neurological exam was grossly nonfocal.  Psychiatric:        Mood and Affect: Mood normal.        Behavior: Behavior normal.      UC Treatments / Results  Labs (all labs ordered are listed, but only abnormal results are displayed) Labs Reviewed - No data to display  EKG   Radiology No results found.  Procedures Procedures (including critical care time)  Medications Ordered in UC Medications - No data to display  Initial Impression / Assessment and Plan / UC Course  I have reviewed the triage vital signs and the nursing notes.  Pertinent labs & imaging results that were available during my care of the patient were reviewed by me and considered in my medical decision making (see chart for details).  Clinical impression: 1.  Benign positional vertigo with left ear pain and facial pain.  She has had a similar episode a year ago that responded to meclizine. 2.  Impacted cerumen of both ears but symptomatic on the left. 3.  Nausea without vomiting  Treatment plan: 1.  The findings and treatment plan were discussed in detail with the patient.  Patient was in agreement. 2.  For her benign positional vertigo sent in a prescription for meclizine. 3.  For nausea sent a prescription for Zofran. 4.  For her impacted cerumen went ahead and irrigated both ears.  Afterwards she had some significant improvement in her ear pressure and left-sided facial pressure.  I believe that may be contributing to her vertigo. 5.  Encouraged her use over-the-counter remedies for her cerumen impaction and earwax removal. 6.  Educational handouts provided. 7.  Tylenol and Motrin for any fever or  discomfort. 8.  Red flag signs and symptoms were discussed in detail.  Certainly if she develops any concerning signs for worsening headache, facial numbness or tingling, or any signs or symptoms of stroke or an MRI she should call 911 and go directly to the emergency room.  She voiced verbal understanding. 9.  I have encouraged her to get in with her primary care provider for follow-up. 10.  Follow-up here as needed.    Final Clinical Impressions(s) / UC Diagnoses   Final diagnoses:  Benign paroxysmal positional vertigo of left ear  Dizziness  Left facial pressure and pain  Impacted cerumen of both ears  Nausea without vomiting     Discharge Instructions     You have benign positional vertigo.  I sent in a medicine for that. He also have impacted cerumen which may be contributing to your left-sided facial pain and ear pain.  We performed an irrigation.  I hope that helps you. I also sent in a medicine for your nausea. I provided some educational handouts.  Please  read them when you get some time. Just supportive care, plenty of rest, plenty of fluids, Tylenol or Motrin for any discomfort.  If you develop worsening headache, vision changes, chest pain, shortness of breath, numbness or tingling in your extremities, or jaw pain or any other concerning signs or symptoms please go directly to the emergency room.  Please follow-up with your primary care provider if your symptoms persist.  I hope you get the feeling better, Dr. Drema Dallas   ED Prescriptions    Medication Sig Stokes. Provider   ondansetron (ZOFRAN) 4 MG tablet Take 1 tablet (4 mg total) by mouth every 6 (six) hours. 12 tablet Verda Cumins, MD   meclizine (ANTIVERT) 25 MG tablet Take 1 tablet (25 mg total) by mouth 3 (three) times daily as needed for dizziness. 30 tablet Verda Cumins, MD     PDMP not reviewed this encounter.   Verda Cumins, MD 12/15/20 1007

## 2020-12-15 NOTE — Discharge Instructions (Addendum)
You have benign positional vertigo.  I sent in a medicine for that. He also have impacted cerumen which may be contributing to your left-sided facial pain and ear pain.  We performed an irrigation.  I hope that helps you. I also sent in a medicine for your nausea. I provided some educational handouts.  Please read them when you get some time. Just supportive care, plenty of rest, plenty of fluids, Tylenol or Motrin for any discomfort.  If you develop worsening headache, vision changes, chest pain, shortness of breath, numbness or tingling in your extremities, or jaw pain or any other concerning signs or symptoms please go directly to the emergency room.  Please follow-up with your primary care provider if your symptoms persist.  I hope you get the feeling better, Dr. Drema Dallas

## 2021-07-16 ENCOUNTER — Other Ambulatory Visit: Payer: Self-pay | Admitting: Internal Medicine

## 2021-07-16 DIAGNOSIS — Z1231 Encounter for screening mammogram for malignant neoplasm of breast: Secondary | ICD-10-CM

## 2021-08-12 ENCOUNTER — Inpatient Hospital Stay: Admission: RE | Admit: 2021-08-12 | Payer: Federal, State, Local not specified - PPO | Source: Ambulatory Visit

## 2021-09-14 ENCOUNTER — Other Ambulatory Visit: Payer: Self-pay | Admitting: Podiatry

## 2021-09-15 ENCOUNTER — Other Ambulatory Visit: Payer: Self-pay

## 2021-09-15 ENCOUNTER — Other Ambulatory Visit
Admission: RE | Admit: 2021-09-15 | Discharge: 2021-09-15 | Disposition: A | Discharge status: Home / Self Care | Payer: Federal, State, Local not specified - PPO | Source: Ambulatory Visit | Attending: Podiatry | Admitting: Podiatry

## 2021-09-15 DIAGNOSIS — I10 Essential (primary) hypertension: Secondary | ICD-10-CM

## 2021-09-15 HISTORY — DX: Gastro-esophageal reflux disease without esophagitis: K21.9

## 2021-09-15 NOTE — Patient Instructions (Addendum)
Your procedure is scheduled on: 09/17/21 - FR[place] Report to the Registration Desk on the 1st floor of the [facility]. To find out your arrival time, please call [phone] between 1PM - 3PM on: 09/16/21 - Thursday  REMEMBER: Instructions that are not followed completely may result in serious medical risk, up to and including death; or upon the discretion of your surgeon and anesthesiologist your surgery may need to be rescheduled.  Do not eat food after midnight the night before surgery.  No gum chewing, lozengers or hard candies.  You may however, drink CLEAR liquids up to 2 hours before you are scheduled to arrive for your surgery. Do not drink anything within 2 hours of your scheduled arrival time.  Clear liquids include: - water  Type 1 and Type 2 diabetics should only drink water.  TAKE THESE MEDICATIONS THE MORNING OF SURGERY WITH A SIP OF WATER:  - atorvastatin (LIPITOR) 40 MG tablet - famotidine (PEPCID) 20 MG tablet  SYNJARDY XR 12.01-999 MG TB24 do not take 12/21, 12/22 and do not take the day of surgery.  insulin aspart (NOVOLOG) 100 UNIT/ML injection - inject NONE the morning of surgery.  One week prior to surgery: Stop Anti-inflammatories (NSAIDS) such as Advil, Aleve, Ibuprofen, Motrin, Naproxen, Naprosyn and Aspirin based products such as Excedrin, Goodys Powder, BC Powder.  Stop ANY OVER THE COUNTER supplements until after surgery.  You may however, continue to take Tylenol if needed for pain up until the day of surgery.  No Alcohol for 24 hours before or after surgery.  No Smoking including e-cigarettes for 24 hours prior to surgery.  No chewable tobacco products for at least 6 hours prior to surgery.  No nicotine patches on the day of surgery.  Do not use any "recreational" drugs for at least a week prior to your surgery.  Please be advised that the combination of cocaine and anesthesia may have negative outcomes, up to and including death. If you test  positive for cocaine, your surgery will be cancelled.  On the morning of surgery brush your teeth with toothpaste and water, you may rinse your mouth with mouthwash if you wish. Do not swallow any toothpaste or mouthwash.  Use CHG Soap or wipes as directed on instruction sheet.  Do not wear jewelry, make-up, hairpins, clips or nail polish.  Do not wear lotions, powders, or perfumes.   Do not shave body from the neck down 48 hours prior to surgery just in case you cut yourself which could leave a site for infection.  Also, freshly shaved skin may become irritated if using the CHG soap.  Contact lenses, hearing aids and dentures may not be worn into surgery.  Do not bring valuables to the hospital. [facility] is not responsible for any missing/lost belongings or valuables.   Notify your doctor if there is any change in your medical condition (cold, fever, infection).  Wear comfortable clothing (specific to your surgery type) to the hospital.  After surgery, you can help prevent lung complications by doing breathing exercises.  Take deep breaths and cough every 1-2 hours. Your doctor may order a device called an Incentive Spirometer to help you take deep breaths. When coughing or sneezing, hold a pillow firmly against your incision with both hands. This is called splinting. Doing this helps protect your incision. It also decreases belly discomfort.  If you are being admitted to the hospital overnight, leave your suitcase in the car. After surgery it may be brought to  your room.  If you are being discharged the day of surgery, you will not be allowed to drive home. You will need a responsible adult (18 years or older) to drive you home and stay with you that night.   If you are taking public transportation, you will need to have a responsible adult (18 years or older) with you. Please confirm with your physician that it is acceptable to use public transportation.   Please call the  [facility] at [phone] if you have any questions about these instructions.  Surgery Visitation Policy:  Patients undergoing a surgery or procedure may have one family member or support person with them as long as that person is not COVID-19 positive or experiencing its symptoms.  That person may remain in the waiting area during the procedure and may rotate out with other people.  Inpatient Visitation:    Visiting hours are 7 a.m. to 8 p.m. Up to two visitors ages 16+ are allowed at one time in a patient room. The visitors may rotate out with other people during the day. Visitors must check out when they leave, or other visitors will not be allowed. One designated support person may remain overnight. The visitor must pass COVID-19 screenings, use hand sanitizer when entering and exiting the patients room and wear a mask at all times, including in the patients room. Patients must also wear a mask when staff or their visitor are in the room. Masking is required regardless of vaccination status.

## 2021-09-16 ENCOUNTER — Other Ambulatory Visit
Admission: RE | Admit: 2021-09-16 | Discharge: 2021-09-16 | Disposition: A | Discharge status: Home / Self Care | Payer: Federal, State, Local not specified - PPO | Source: Ambulatory Visit | Attending: Podiatry | Admitting: Podiatry

## 2021-09-16 ENCOUNTER — Encounter: Payer: Self-pay | Admitting: Podiatry

## 2021-09-16 DIAGNOSIS — Z01810 Encounter for preprocedural cardiovascular examination: Secondary | ICD-10-CM | POA: Diagnosis not present

## 2021-09-16 DIAGNOSIS — Z01818 Encounter for other preprocedural examination: Secondary | ICD-10-CM | POA: Insufficient documentation

## 2021-09-16 DIAGNOSIS — X58XXXA Exposure to other specified factors, initial encounter: Secondary | ICD-10-CM | POA: Diagnosis not present

## 2021-09-16 DIAGNOSIS — T85848A Pain due to other internal prosthetic devices, implants and grafts, initial encounter: Secondary | ICD-10-CM | POA: Diagnosis present

## 2021-09-16 DIAGNOSIS — I10 Essential (primary) hypertension: Secondary | ICD-10-CM

## 2021-09-16 LAB — BASIC METABOLIC PANEL
Anion gap: 8 (ref 5–15)
BUN: 16 mg/dL (ref 6–20)
CO2: 29 mmol/L (ref 22–32)
Calcium: 9.7 mg/dL (ref 8.9–10.3)
Chloride: 102 mmol/L (ref 98–111)
Creatinine, Ser: 1.02 mg/dL — ABNORMAL HIGH (ref 0.44–1.00)
GFR, Estimated: >60 mL/min (ref >60)
Glucose, Bld: 113 mg/dL — ABNORMAL HIGH (ref 70–99)
Potassium: 3.5 mmol/L (ref 3.5–5.1)
Sodium: 139 mmol/L (ref 135–145)

## 2021-09-16 LAB — CBC
HCT: 45.7 % (ref 36.0–46.0)
Hemoglobin: 14.6 g/dL (ref 12.0–15.0)
MCH: 27 pg (ref 26.0–34.0)
MCHC: 31.9 g/dL (ref 30.0–36.0)
MCV: 84.6 fL (ref 80.0–100.0)
Platelets: 177 10*3/uL (ref 150–400)
RBC: 5.4 MIL/uL — ABNORMAL HIGH (ref 3.87–5.11)
RDW: 15.2 % (ref 11.5–15.5)
WBC: 4.7 10*3/uL (ref 4.0–10.5)
nRBC: 0 % (ref 0.0–0.2)

## 2021-09-16 MED ORDER — CLINDAMYCIN PHOSPHATE 900 MG/50ML IV SOLN
900.0000 mg | INTRAVENOUS | Status: AC
  Administered 2021-09-17: 13:00:00 900 mg via INTRAVENOUS

## 2021-09-16 MED ORDER — ORAL CARE MOUTH RINSE: 15.0000 mL | Freq: Once | OROMUCOSAL | Status: AC

## 2021-09-16 MED ORDER — CHLORHEXIDINE GLUCONATE 0.12 % MT SOLN: 15.0000 mL | Freq: Once | OROMUCOSAL | Status: AC

## 2021-09-16 MED ORDER — SODIUM CHLORIDE 0.9 % IV SOLN: INTRAVENOUS | Status: DC

## 2021-09-16 NOTE — Progress Notes (Signed)
Perioperative Services Pre-Admission/Anesthesia Testing   Date: 09/16/21 Name: Jazzman Loughmiller MRN:   [id number]  Re: Consideration of preoperative prophylactic antibiotic change   Request sent to: Samara Deist, DPM (routed and/or faxed via [facility])  Planned Surgical Procedure(s):    Case: [id number] Date/Time: 09/17/21 1407   Procedure: HARDWARE REMOVAL (Right)   Anesthesia type: Choice   Pre-op diagnosis:      T85.848A - pain from implanted hardware, initial encounter     M79.671 - right foot pain   Location: [facility] OR ROOM 02 / [facility]   Surgeons: Samara Deist, DPM   Clinical Notes:  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 No shortness of breath or tongue swelling. Thanks".  Screened as appropriate for cephalosporin use during medication reconciliation No immediate angioedema, dysphagia, SOB, anaphylaxis symptoms. No severe rash involving mucous membranes or skin necrosis. No hospital admissions related to side effects of PCN/cephalosporin use.  No documented reaction to PCN or cephalosporin in the last 10 years.  Request:  As an evidence based approach to reducing the rate of incidence for post-operative SSI and the development of MDROs, could an agent with narrower coverage for preoperative prophylaxis in this patient's upcoming surgical course be considered?   Currently ordered preoperative prophylactic ABX: clindamycin.   Specifically requesting change to cephalosporin (CEFAZOLIN).   Please communicate decision with me and I will change the orders in Epic as per your direction.   Things to consider: Many patients report that they  were "allergic" to PCN earlier in life, however this does not translate into a true lifelong allergy. Patients can lose sensitivity to specific IgE antibodies over time if PCN is avoided (Kleris & Lugar, 2019).  Up to 10% of the adult population and 15% of hospitalized patients report an allergy to PCN, however clinical studies suggest that 90% of those reporting an allergy can tolerate PCN antibiotics (Kleris & Lugar, 2019).  Cross-sensitivity between PCN and cephalosporins has been documented as being as high as 10%, however this estimation included data believed to have been collected in a setting where there was contamination. Newer data suggests that the prevalence of cross-sensitivity between PCN and cephalosporins is actually estimated to be closer to 1% (Hermanides et al., 2018).   Patients labeled as PCN allergic, whether they are truly allergic or not, have been found to have inferior outcomes in terms of rates of serious infection, and these patients tend to have longer hospital stays ([place], 2019).  Treatment related secondary infections, such as Clostridioides difficile, have been linked to the improper use of broad spectrum antibiotics in patients improperly labeled as PCN allergic (Kleris & Lugar, 2019).  Anaphylaxis from cephalosporins is rare and the evidence suggests that there is no increased risk of an anaphylactic type reaction when cephalosporins are used in a PCN allergic patient (Pichichero, 2006).  Citations: Hermanides J, Lemkes BA, Prins Pearla Dubonnet MW, Terreehorst I. Presumed ?-Lactam Allergy and Cross-reactivity in the Operating Theater: A Practical Approach. Anesthesiology. 2018 Aug;129(2):335-342. doi: 10.1097/ALN.0000000000002252. PMID: 31540086.  Kleris, [place]., & Lugar, P. L. (2019). Things We Do For No Reason: Failing to  Question a Penicillin Allergy History. Journal of hospital medicine, 14(10), [phone]. Advance online publication.  https://www.wallace-m[place]leton.info/  Pichichero, M. E. (2006). Cephalosporins can be prescribed safely for penicillin-allergic patients. Journal of family medicine, 55(2), 106-112. Accessed: https://cdn.mdedge.com/files/s55fs-public/Document/September-2017/5502JFP_AppliedEvidence1.pdf   Honor Loh, MSN, APRN, FNP-C, CEN [facility]  Peri-operative Services Nurse Practitioner FAX: [phone] 09/16/21 9:58 AM

## 2021-09-17 ENCOUNTER — Ambulatory Visit: Payer: Federal, State, Local not specified - PPO

## 2021-09-17 ENCOUNTER — Ambulatory Visit: Payer: Federal, State, Local not specified - PPO | Admitting: Urgent Care

## 2021-09-17 ENCOUNTER — Ambulatory Visit
Admission: RE | Admit: 2021-09-17 | Discharge: 2021-09-17 | Disposition: A | Discharge status: Home / Self Care | Payer: Federal, State, Local not specified - PPO | Attending: Podiatry | Admitting: Podiatry

## 2021-09-17 ENCOUNTER — Other Ambulatory Visit: Payer: Self-pay

## 2021-09-17 ENCOUNTER — Encounter: Payer: Self-pay | Admitting: Podiatry

## 2021-09-17 ENCOUNTER — Encounter
Admission: RE | Disposition: A | Discharge status: Home / Self Care | Payer: Self-pay | Source: Home / Self Care | Attending: Podiatry

## 2021-09-17 DIAGNOSIS — T85848A Pain due to other internal prosthetic devices, implants and grafts, initial encounter: Secondary | ICD-10-CM | POA: Diagnosis not present

## 2021-09-17 DIAGNOSIS — X58XXXA Exposure to other specified factors, initial encounter: Secondary | ICD-10-CM | POA: Insufficient documentation

## 2021-09-17 HISTORY — PX: HARDWARE REMOVAL: SHX979

## 2021-09-17 HISTORY — DX: Type 2 diabetes mellitus without complications: E11.9

## 2021-09-17 HISTORY — DX: Monoclonal gammopathy: D47.2

## 2021-09-17 LAB — GLUCOSE, CAPILLARY
Glucose-Capillary: 111 mg/dL — ABNORMAL HIGH (ref 70–99)
Glucose-Capillary: 121 mg/dL — ABNORMAL HIGH (ref 70–99)

## 2021-09-17 SURGERY — REMOVAL, HARDWARE
Anesthesia: General | Site: Foot | Laterality: Right

## 2021-09-17 MED ORDER — BUPIVACAINE HCL (PF) 0.5 % IJ SOLN
INTRAMUSCULAR | Status: AC
  Filled 2021-09-17: qty 30

## 2021-09-17 MED ORDER — ONDANSETRON HCL 4 MG/2ML IJ SOLN: 4.0000 mg | Freq: Four times a day (QID) | INTRAMUSCULAR | Status: DC | PRN

## 2021-09-17 MED ORDER — CLINDAMYCIN PHOSPHATE 900 MG/50ML IV SOLN
INTRAVENOUS | Status: AC
  Filled 2021-09-17: qty 50

## 2021-09-17 MED ORDER — METOCLOPRAMIDE HCL 5 MG/ML IJ SOLN: 5.0000 mg | Freq: Three times a day (TID) | INTRAMUSCULAR | Status: DC | PRN

## 2021-09-17 MED ORDER — FENTANYL CITRATE (PF) 100 MCG/2ML IJ SOLN
INTRAMUSCULAR | Status: DC | PRN
  Administered 2021-09-17 (×2): 50 ug via INTRAVENOUS

## 2021-09-17 MED ORDER — PROPOFOL 10 MG/ML IV BOLUS
INTRAVENOUS | Status: DC | PRN
  Administered 2021-09-17: 160 mg via INTRAVENOUS

## 2021-09-17 MED ORDER — LIDOCAINE HCL (CARDIAC) PF 100 MG/5ML IV SOSY
PREFILLED_SYRINGE | INTRAVENOUS | Status: DC | PRN
  Administered 2021-09-17: 50 mg via INTRAVENOUS

## 2021-09-17 MED ORDER — FENTANYL CITRATE (PF) 100 MCG/2ML IJ SOLN: 25.0000 ug | INTRAMUSCULAR | Status: DC | PRN

## 2021-09-17 MED ORDER — MIDAZOLAM HCL 2 MG/2ML IJ SOLN: INTRAMUSCULAR | Status: DC | PRN

## 2021-09-17 MED ORDER — ONDANSETRON HCL 4 MG/2ML IJ SOLN
INTRAMUSCULAR | Status: DC | PRN
  Administered 2021-09-17: 4 mg via INTRAVENOUS

## 2021-09-17 MED ORDER — CHLORHEXIDINE GLUCONATE 0.12 % MT SOLN
OROMUCOSAL | Status: AC
  Administered 2021-09-17: 13:00:00 15 mL via OROMUCOSAL
  Filled 2021-09-17: qty 15

## 2021-09-17 MED ORDER — ONDANSETRON HCL 4 MG/2ML IJ SOLN
INTRAMUSCULAR | Status: AC
  Filled 2021-09-17: qty 2

## 2021-09-17 MED ORDER — DEXAMETHASONE SODIUM PHOSPHATE 10 MG/ML IJ SOLN
INTRAMUSCULAR | Status: DC | PRN
  Administered 2021-09-17: 10 mg via INTRAVENOUS

## 2021-09-17 MED ORDER — MIDAZOLAM HCL 2 MG/2ML IJ SOLN
INTRAMUSCULAR | Status: AC
  Filled 2021-09-17: qty 2

## 2021-09-17 MED ORDER — LIDOCAINE HCL (PF) 1 % IJ SOLN
INTRAMUSCULAR | Status: DC | PRN
  Administered 2021-09-17: 4 mL via INTRAMUSCULAR

## 2021-09-17 MED ORDER — ONDANSETRON HCL 4 MG PO TABS: 4.0000 mg | ORAL_TABLET | Freq: Four times a day (QID) | ORAL | Status: DC | PRN

## 2021-09-17 MED ORDER — FENTANYL CITRATE (PF) 100 MCG/2ML IJ SOLN
INTRAMUSCULAR | Status: AC
  Filled 2021-09-17: qty 2

## 2021-09-17 MED ORDER — MIDAZOLAM HCL 2 MG/2ML IJ SOLN
INTRAMUSCULAR | Status: DC | PRN
  Administered 2021-09-17: 2 mg via INTRAVENOUS

## 2021-09-17 MED ORDER — LIDOCAINE HCL (PF) 1 % IJ SOLN
INTRAMUSCULAR | Status: AC
  Filled 2021-09-17: qty 30

## 2021-09-17 MED ORDER — 0.9 % SODIUM CHLORIDE (POUR BTL) OPTIME
TOPICAL | Status: DC | PRN
  Administered 2021-09-17: 13:00:00 500 mL

## 2021-09-17 MED ORDER — METOCLOPRAMIDE HCL 10 MG PO TABS: 5.0000 mg | ORAL_TABLET | Freq: Three times a day (TID) | ORAL | Status: DC | PRN

## 2021-09-17 MED ORDER — OXYCODONE HCL 5 MG PO TABS
ORAL_TABLET | ORAL | Status: AC
  Filled 2021-09-17: qty 1

## 2021-09-17 MED ORDER — OXYCODONE HCL 5 MG PO TABS
5.0000 mg | ORAL_TABLET | ORAL | Status: DC | PRN
  Administered 2021-09-17: 15:00:00 5 mg via ORAL

## 2021-09-17 MED ORDER — ONDANSETRON HCL 4 MG/2ML IJ SOLN
4.0000 mg | Freq: Once | INTRAMUSCULAR | Status: AC | PRN
  Administered 2021-09-17: 14:00:00 4 mg via INTRAVENOUS

## 2021-09-17 SURGICAL SUPPLY — 40 items
BLADE SURG 15 STRL LF DISP TIS (BLADE) ×2 IMPLANT
BLADE SURG 15 STRL SS (BLADE) ×4
BLADE SURG MINI STRL (BLADE) ×3 IMPLANT
BNDG COHESIVE 4X5 TAN ST LF (GAUZE/BANDAGES/DRESSINGS) ×2 IMPLANT
BNDG CONFORM 3 STRL LF (GAUZE/BANDAGES/DRESSINGS) ×3 IMPLANT
BNDG ELASTIC 4X5.8 VLCR NS LF (GAUZE/BANDAGES/DRESSINGS) ×3 IMPLANT
BNDG ESMARK 4X12 TAN STRL LF (GAUZE/BANDAGES/DRESSINGS) ×3 IMPLANT
CLOSURE WOUND 1/4X4 (GAUZE/BANDAGES/DRESSINGS)
CUFF TOURN SGL QUICK 12 (TOURNIQUET CUFF) IMPLANT
CUFF TOURN SGL QUICK 18X4 (TOURNIQUET CUFF) IMPLANT
DRAPE FLUOR MINI C-ARM 54X84 (DRAPES) ×5 IMPLANT
DURAPREP 26ML APPLICATOR (WOUND CARE) ×3 IMPLANT
ELECT REM PT RETURN 9FT ADLT (ELECTROSURGICAL) ×3
ELECTRODE REM PT RTRN 9FT ADLT (ELECTROSURGICAL) ×1 IMPLANT
GAUZE 4X4 16PLY ~~LOC~~+RFID DBL (SPONGE) ×3 IMPLANT
GAUZE SPONGE 4X4 12PLY STRL (GAUZE/BANDAGES/DRESSINGS) ×5 IMPLANT
GAUZE XEROFORM 1X8 LF (GAUZE/BANDAGES/DRESSINGS) ×3 IMPLANT
GLOVE SURG ENC MOIS LTX SZ7.5 (GLOVE) ×3 IMPLANT
GLOVE SURG UNDER LTX SZ8 (GLOVE) ×3 IMPLANT
GOWN STRL REUS W/ TWL XL LVL3 (GOWN DISPOSABLE) ×2 IMPLANT
GOWN STRL REUS W/TWL XL LVL3 (GOWN DISPOSABLE) ×4
MANIFOLD NEPTUNE II (INSTRUMENTS) ×3 IMPLANT
NDL FILTER BLUNT 18X1 1/2 (NEEDLE) ×1 IMPLANT
NDL HYPO 25X1 1.5 SAFETY (NEEDLE) ×3 IMPLANT
NEEDLE FILTER BLUNT 18X 1/2SAF (NEEDLE) ×2
NEEDLE FILTER BLUNT 18X1 1/2 (NEEDLE) ×1 IMPLANT
NEEDLE HYPO 25X1 1.5 SAFETY (NEEDLE) ×9 IMPLANT
NS IRRIG 500ML POUR BTL (IV SOLUTION) ×3 IMPLANT
PACK EXTREMITY ARMC (MISCELLANEOUS) ×3 IMPLANT
STOCKINETTE STRL 6IN 960660 (GAUZE/BANDAGES/DRESSINGS) ×3 IMPLANT
STRIP CLOSURE SKIN 1/4X4 (GAUZE/BANDAGES/DRESSINGS) ×1 IMPLANT
SUT ETHILON 4-0 (SUTURE) ×2
SUT ETHILON 4-0 FS2 18XMFL BLK (SUTURE) ×1
SUT VIC AB 4-0 FS2 27 (SUTURE) ×3 IMPLANT
SUTURE ETHLN 4-0 FS2 18XMF BLK (SUTURE) IMPLANT
SWABSTK COMLB BENZOIN TINCTURE (MISCELLANEOUS) ×1 IMPLANT
SYR 10ML LL (SYRINGE) ×3 IMPLANT
SYR 20ML LL LF (SYRINGE) ×3 IMPLANT
SYR 3ML LL SCALE MARK (SYRINGE) ×3 IMPLANT
WATER STERILE IRR 500ML POUR (IV SOLUTION) ×1 IMPLANT

## 2021-09-17 NOTE — Anesthesia Procedure Notes (Signed)
Procedure Name: LMA Insertion Date/Time: 09/17/2021 12:38 PM Performed by: Lesle Reek, CRNA Pre-anesthesia Checklist: Patient identified, Emergency Drugs available, Suction available, Patient being monitored and Timeout performed Patient Re-evaluated:Patient Re-evaluated prior to induction Oxygen Delivery Method: Circle system utilized Preoxygenation: Pre-oxygenation with 100% oxygen Induction Type: IV induction LMA: LMA inserted LMA Size: 3.5 Number of attempts: 1 Placement Confirmation: positive ETCO2, CO2 detector and breath sounds checked- equal and bilateral Tube secured with: Tape

## 2021-09-17 NOTE — Anesthesia Preprocedure Evaluation (Signed)
Anesthesia Evaluation  Patient identified by MRN, date of birth, ID band Patient awake    Reviewed: Allergy & Precautions, H&P , NPO status , Patient's Chart, lab work & pertinent test results, reviewed documented beta blocker date and time   Airway Mallampati: II  TM Distance: >3 FB Neck ROM: full    Dental  (+) Teeth Intact   Pulmonary neg pulmonary ROS,    Pulmonary exam normal        Cardiovascular Exercise Tolerance: Good hypertension, On Medications negative cardio ROS Normal cardiovascular exam Rate:Normal     Neuro/Psych negative neurological ROS  negative psych ROS   GI/Hepatic negative GI ROS, Neg liver ROS, GERD  Medicated,  Endo/Other  negative endocrine ROSdiabetes, Well Controlled, Type 2, Oral Hypoglycemic Agents  Renal/GU negative Renal ROS  negative genitourinary   Musculoskeletal   Abdominal   Peds  Hematology negative hematology ROS (+)   Anesthesia Other Findings   Reproductive/Obstetrics negative OB ROS                             Anesthesia Physical Anesthesia Plan  ASA: 2  Anesthesia Plan: General LMA   Post-op Pain Management:    Induction:   PONV Risk Score and Plan: 4 or greater  Airway Management Planned:   Additional Equipment:   Intra-op Plan:   Post-operative Plan:   Informed Consent: I have reviewed the patients History and Physical, chart, labs and discussed the procedure including the risks, benefits and alternatives for the proposed anesthesia with the patient or authorized representative who has indicated his/her understanding and acceptance.       Plan Discussed with: CRNA  Anesthesia Plan Comments:         Anesthesia Quick Evaluation

## 2021-09-17 NOTE — H&P (Signed)
HISTORY AND PHYSICAL INTERVAL NOTE:  09/17/2021  12:46 PM  Toni Mcconnell Toni Mcconnell  has presented today for surgery, with the diagnosis of T85.848A - pain from implanted hardware, initial encounter M79.671 - right foot pain.  The various methods of treatment have been discussed with the patient.  No guarantees were given.  After consideration of risks, benefits and other options for treatment, the patient has consented to surgery.  I have reviewed the patients chart and labs.     A history and physical examination was performed in my office.  The patient was reexamined.  There have been no changes to this history and physical examination.  [name]

## 2021-09-17 NOTE — Transfer of Care (Signed)
Immediate Anesthesia Transfer of Care Note  Patient: Toni Mcconnell  Procedure(s) Performed: HARDWARE REMOVAL (Right: Foot)  Patient Location: PACU  Anesthesia Type:General  Level of Consciousness: awake and alert   Airway & Oxygen Therapy: Patient Spontanous Breathing  Post-op Assessment: Report given to RN  Post vital signs: Reviewed and stable  Last Vitals:  Vitals Value Taken Time  BP    Temp    Pulse 91 09/17/21 1339  Resp 22 09/17/21 1339  SpO2 96 % 09/17/21 1339  Vitals shown include unvalidated device data.  Last Pain:  Vitals:   09/17/21 1232  TempSrc: Temporal  PainSc: 4          Complications: No notable events documented.

## 2021-09-17 NOTE — Op Note (Signed)
Operative note   Surgeon:Annakate Soulier Lawyer: None    Preop diagnosis: Retained painful screw right first metatarsal    Postop diagnosis: Same    Procedure: Removal of screw right first metatarsal    EBL: Minimal    Anesthesia:local and general.  Local consisted of a total of 4 cc of 0.5% bupivacaine plain    Hemostasis: Ankle tourniquet inflated to 200 mmHg for 12 minutes    Specimen: None    Complications: None    Operative indications:Toni Mcconnell is an 60 y.o. that presents today for surgical intervention.  The risks/benefits/alternatives/complications have been discussed and consent has been given.    Procedure:  Patient was brought into the OR and placed on the operating table in thesupine position. After anesthesia was obtained theright lower extremity was prepped and draped in usual sterile fashion.  Attention was directed to the dorsal aspect of the right foot where a small incision was placed overlying the retained screw.  Sharp and blunt dissection carried down to the subcutaneous tissue.  Deeper incision was performed.  Subperiosteal dissection was performed.  At this time the screw was noted.  This was then removed from the surgical field in toto.  The wound was flushed with copious amounts of irrigation.  Pre and post removal fluoroscopy revealed removal of the entire screw.  Closure was then performed with a 4-0 Vicryl and 4-0 nylon for skin.  A small sterile dressing was applied to the right foot.    Patient tolerated the procedure and anesthesia well.  Was transported from the OR to the PACU with all vital signs stable and vascular status intact. To be discharged per routine protocol.  Will follow up in approximately 1 week in the outpatient clinic.

## 2021-09-17 NOTE — Discharge Instructions (Addendum)
[place]  POST OPERATIVE INSTRUCTIONS FOR DR. Vickki Muff AND DR. [place]   Take your medication as prescribed.  Pain medication should be taken only as needed.  Keep the dressing clean, dry and intact for the next 48 hours.  Can then remove dressing and cover incision site with waterproof bandage for showers.  Cover with small piece of gauze and tape or Band-Aid thereafter.  Keep your foot elevated above the heart level for the first 48 hours.  Walking to the bathroom and brief periods of walking are acceptable, unless we have instructed you to be non-weight bearing.  Always wear your post-op shoe when walking.  Always use your crutches if you are to be non-weight bearing.  Okay to shower in 48 hours.  Change dressing with showers.  Cover incision site with waterproof bandage/Band-Aid.  Every hour you are awake:  Bend your knee 15 times. Flex foot 15 times Massage calf 15 times  Call [facility] [phone]) if any of the following problems occur: You develop a temperature or fever. The bandage becomes saturated with blood. Medication does not stop your pain. Injury of the foot occurs. Any symptoms of infection including redness, odor, or red streaks running from wound.   AMBULATORY SURGERY  DISCHARGE INSTRUCTIONS   The drugs that you were given will stay in your system until tomorrow so for the next 24 hours you should not:  Drive an automobile Make any legal decisions Drink any alcoholic beverage   You may resume regular meals tomorrow.  Today it is better to start with liquids and gradually work up to solid foods.  You may eat anything you prefer, but it is better to start with liquids, then soup and crackers, and gradually work up to solid foods.   Please notify your doctor immediately if you have any unusual bleeding, trouble breathing, redness and pain at the surgery site, drainage,  fever, or pain not relieved by medication.    Additional Instructions:   Please contact your physician with any problems or Same Day Surgery at [phone], Monday through Friday 6 am to 4 pm, or [place] at [facility] number at [phone].

## 2021-09-20 ENCOUNTER — Encounter: Payer: Self-pay | Admitting: Podiatry

## 2021-09-21 NOTE — Anesthesia Postprocedure Evaluation (Signed)
Anesthesia Post Note  Patient: Toni Mcconnell  Procedure(s) Performed: HARDWARE REMOVAL (Right: Foot)  Patient location during evaluation: PACU Anesthesia Type: General Level of consciousness: awake and alert Pain management: pain level controlled Vital Signs Assessment: post-procedure vital signs reviewed and stable Respiratory status: spontaneous breathing, nonlabored ventilation, respiratory function stable and patient connected to nasal cannula oxygen Cardiovascular status: blood pressure returned to baseline and stable Postop Assessment: no apparent nausea or vomiting Anesthetic complications: no   No notable events documented.   Last Vitals:  Vitals:   09/17/21 1453 09/17/21 1527  BP: (!) 154/87 132/87  Pulse: 87 91  Resp: 17 15  Temp: (!) 36.2 C   SpO2: 100% 100%    Last Pain:  Vitals:   09/17/21 1453  TempSrc: Temporal  PainSc: [place] Shantelle Alles

## 2021-09-22 ENCOUNTER — Other Ambulatory Visit: Payer: Federal, State, Local not specified - PPO

## 2021-10-11 ENCOUNTER — Ambulatory Visit
Admission: RE | Admit: 2021-10-11 | Discharge: 2021-10-11 | Disposition: A | Discharge status: Home / Self Care | Payer: Federal, State, Local not specified - PPO | Source: Ambulatory Visit | Attending: Internal Medicine | Admitting: Internal Medicine

## 2021-10-11 ENCOUNTER — Other Ambulatory Visit: Payer: Self-pay

## 2021-10-11 DIAGNOSIS — Z1231 Encounter for screening mammogram for malignant neoplasm of breast: Secondary | ICD-10-CM

## 2022-09-13 ENCOUNTER — Other Ambulatory Visit: Payer: Self-pay | Admitting: Internal Medicine

## 2022-09-13 DIAGNOSIS — Z1231 Encounter for screening mammogram for malignant neoplasm of breast: Secondary | ICD-10-CM

## 2022-10-13 ENCOUNTER — Ambulatory Visit
Admission: RE | Admit: 2022-10-13 | Discharge: 2022-10-13 | Disposition: A | Discharge status: Home / Self Care | Payer: Federal, State, Local not specified - PPO | Source: Ambulatory Visit | Attending: Internal Medicine | Admitting: Internal Medicine

## 2022-10-13 DIAGNOSIS — Z1231 Encounter for screening mammogram for malignant neoplasm of breast: Secondary | ICD-10-CM | POA: Diagnosis present

## 2023-07-18 ENCOUNTER — Other Ambulatory Visit: Payer: Self-pay | Admitting: Gerontology

## 2023-07-18 DIAGNOSIS — G8929 Other chronic pain: Secondary | ICD-10-CM

## 2023-07-18 DIAGNOSIS — R1032 Left lower quadrant pain: Secondary | ICD-10-CM

## 2023-07-18 DIAGNOSIS — R197 Diarrhea, unspecified: Secondary | ICD-10-CM

## 2023-07-18 DIAGNOSIS — R1031 Right lower quadrant pain: Secondary | ICD-10-CM

## 2023-07-24 ENCOUNTER — Ambulatory Visit
Admission: RE | Admit: 2023-07-24 | Discharge: 2023-07-24 | Disposition: A | Discharge status: Home / Self Care | Payer: Federal, State, Local not specified - PPO | Source: Ambulatory Visit | Attending: Gerontology | Admitting: Gerontology

## 2023-07-24 DIAGNOSIS — R1032 Left lower quadrant pain: Secondary | ICD-10-CM | POA: Insufficient documentation

## 2023-07-24 DIAGNOSIS — R1031 Right lower quadrant pain: Secondary | ICD-10-CM | POA: Diagnosis present

## 2023-07-24 DIAGNOSIS — R197 Diarrhea, unspecified: Secondary | ICD-10-CM | POA: Diagnosis present

## 2023-07-24 DIAGNOSIS — R1013 Epigastric pain: Secondary | ICD-10-CM | POA: Insufficient documentation

## 2023-07-24 DIAGNOSIS — G8929 Other chronic pain: Secondary | ICD-10-CM | POA: Diagnosis present

## 2023-09-13 ENCOUNTER — Other Ambulatory Visit: Payer: Self-pay | Admitting: Gerontology

## 2023-09-13 DIAGNOSIS — Z1231 Encounter for screening mammogram for malignant neoplasm of breast: Secondary | ICD-10-CM

## 2023-10-16 ENCOUNTER — Ambulatory Visit
Admission: RE | Admit: 2023-10-16 | Discharge: 2023-10-16 | Disposition: A | Discharge status: Home / Self Care | Payer: Federal, State, Local not specified - PPO | Source: Ambulatory Visit | Attending: Gerontology | Admitting: Gerontology

## 2023-10-16 DIAGNOSIS — Z1231 Encounter for screening mammogram for malignant neoplasm of breast: Secondary | ICD-10-CM | POA: Insufficient documentation

## 2023-12-01 IMAGING — MG MM DIGITAL SCREENING BILAT W/ TOMO AND CAD
8 series · 8 of 24 positions shown · non-contrast
Comparison: Previous exam(s).

CLINICAL DATA: Screening.

EXAM:
DIGITAL SCREENING BILATERAL MAMMOGRAM WITH TOMOSYNTHESIS AND CAD
TECHNIQUE: Bilateral screening digital craniocaudal and mediolateral oblique
mammograms were obtained. Bilateral screening digital breast
tomosynthesis was performed. The images were evaluated with
computer-aided detection.

[L CC synth-2D]
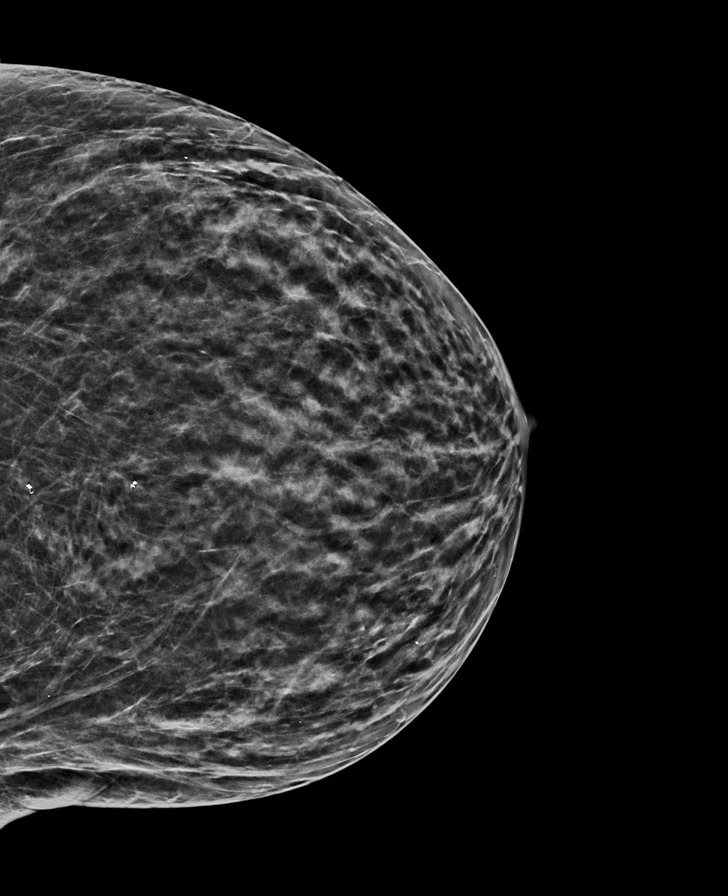

[R MLO synth-2D]
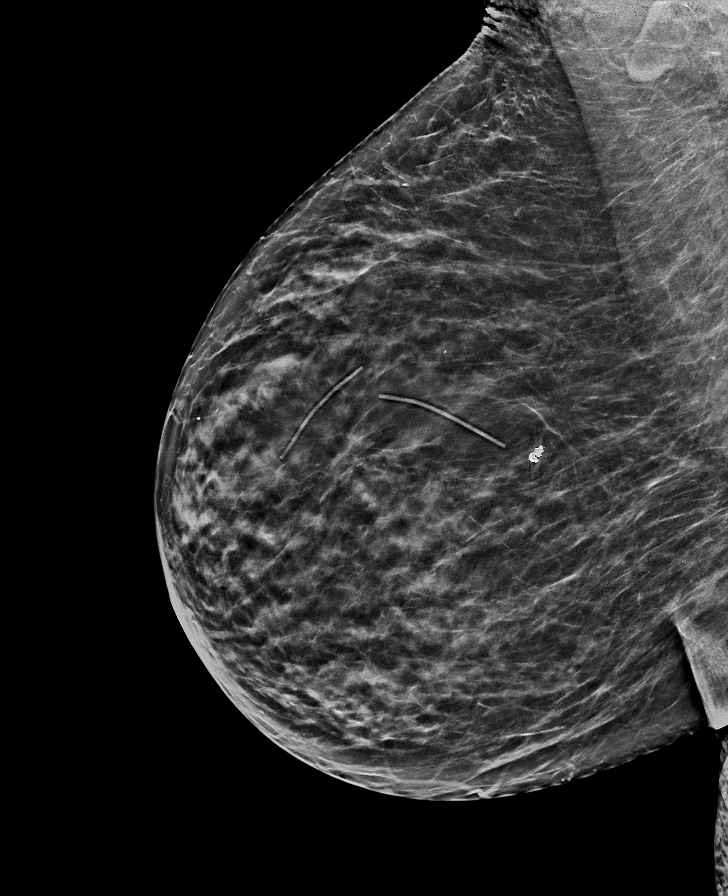

[L MLO synth-2D]
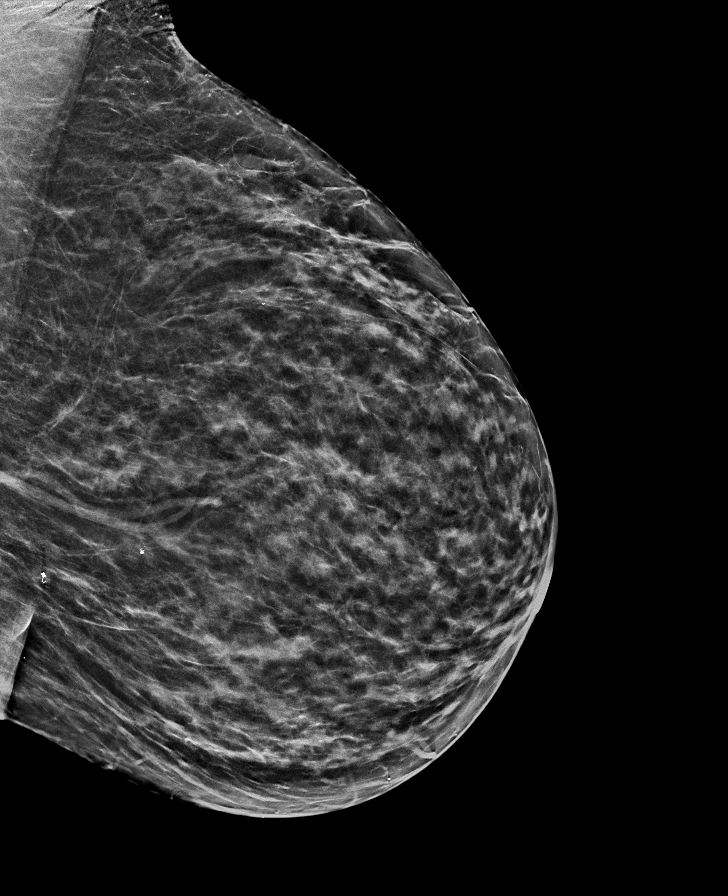

[R CC synth-2D]
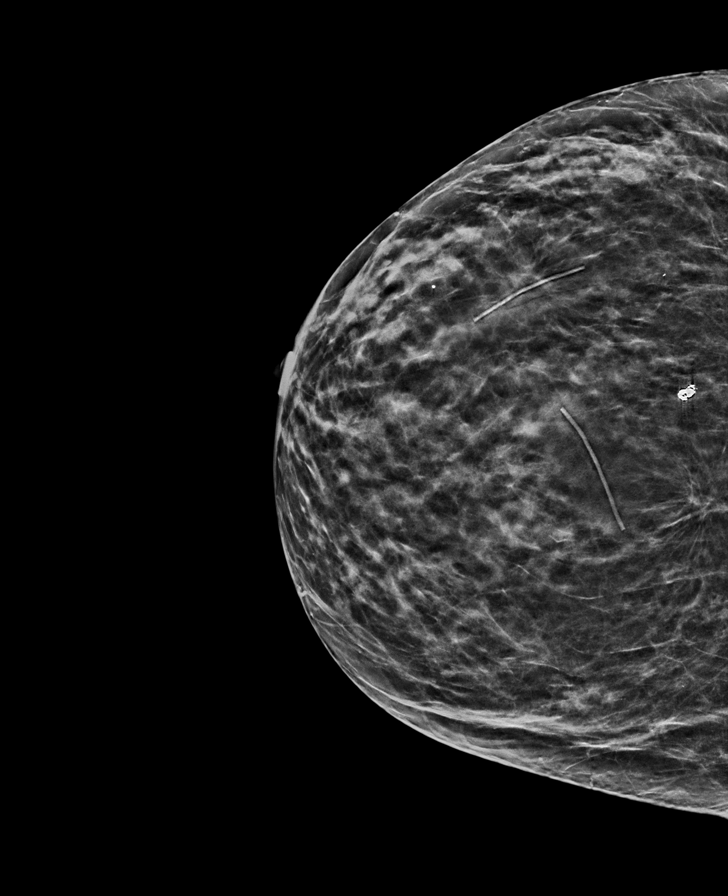

[R MLO tomo · tomo slice 33/65.0]
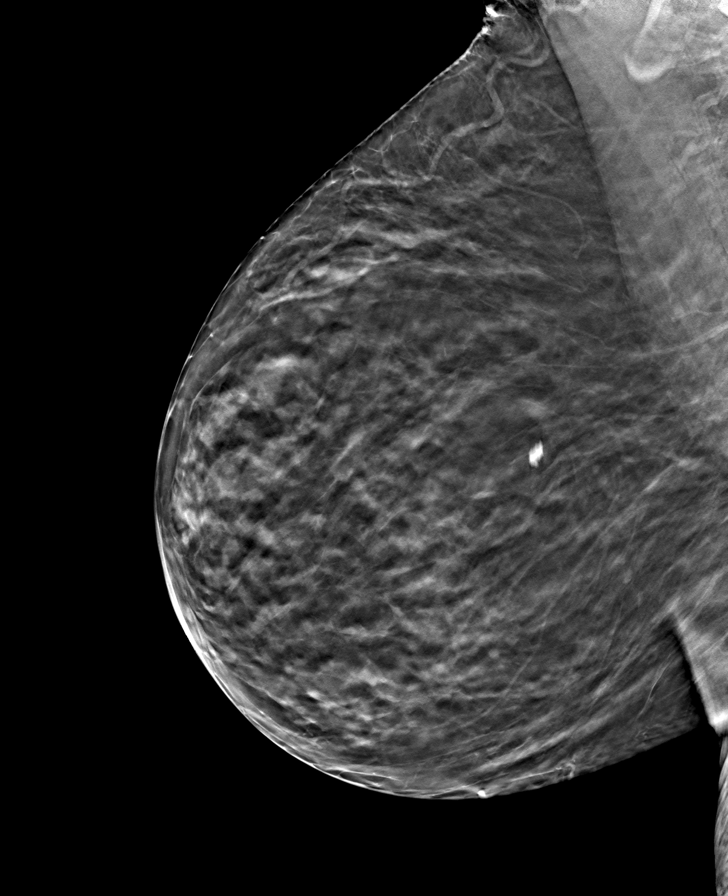

[L MLO tomo · tomo slice 32/63.0]
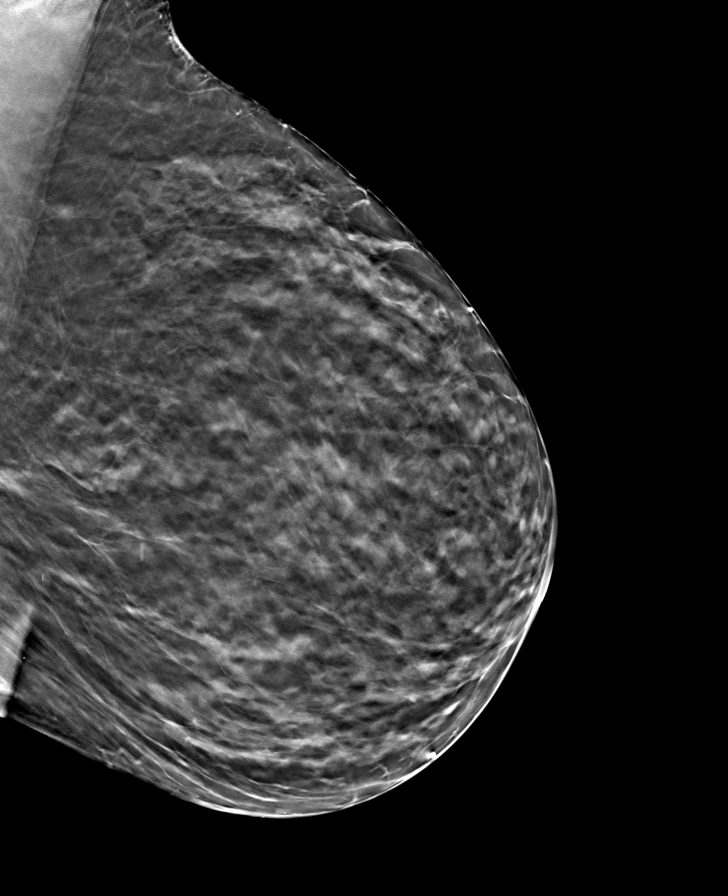

[R CC tomo · tomo slice 29/57.0]
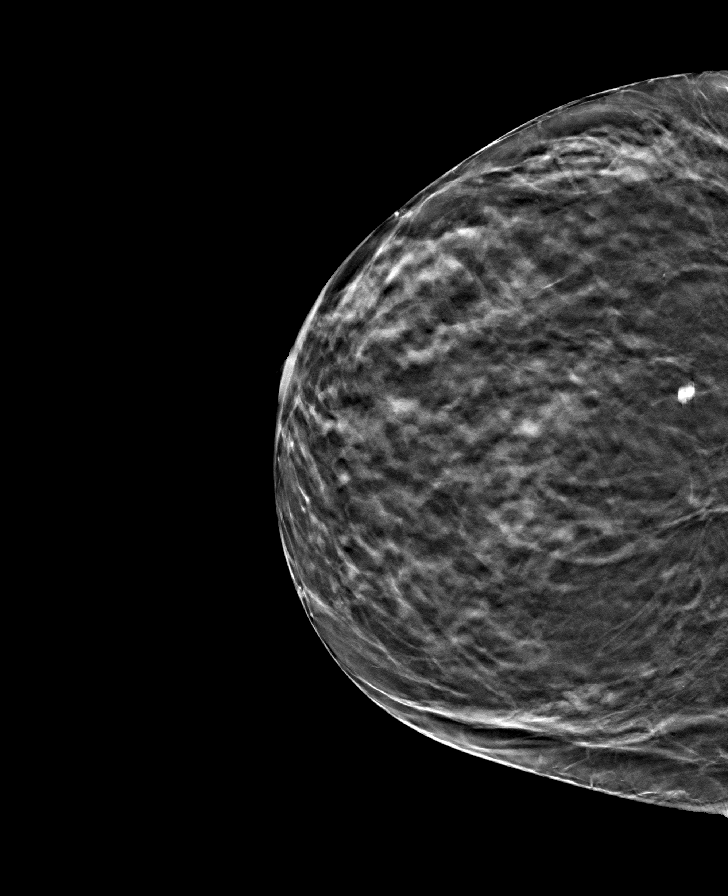

[L CC tomo · tomo slice 30/59.0]
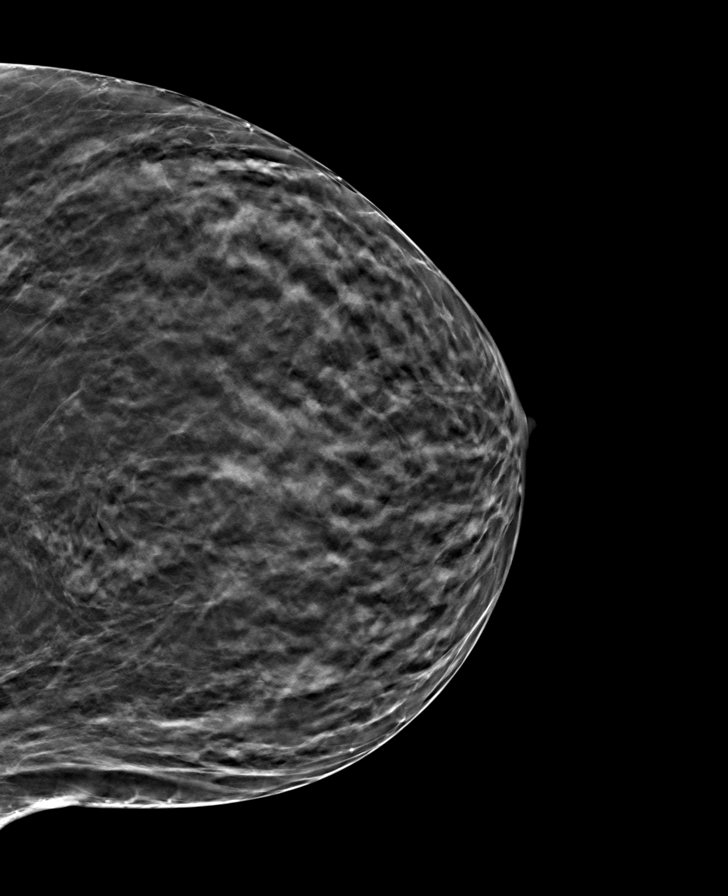

[8 of 24 positions shown; findings below may reference images not displayed]

ACR Breast Density Category c: The breast tissue is heterogeneously
dense, which may obscure small masses.
FINDINGS: There are no findings suspicious for malignancy.
IMPRESSION: No mammographic evidence of malignancy. A result letter of this
screening mammogram will be mailed directly to the patient.

RECOMMENDATION:
Screening mammogram in one year. (Code:Q3-W-BC3)

BI-RADS CATEGORY  1: Negative.

## 2023-12-05 ENCOUNTER — Other Ambulatory Visit: Payer: Self-pay | Admitting: Gerontology

## 2023-12-05 DIAGNOSIS — R935 Abnormal findings on diagnostic imaging of other abdominal regions, including retroperitoneum: Secondary | ICD-10-CM

## 2023-12-05 DIAGNOSIS — N83201 Unspecified ovarian cyst, right side: Secondary | ICD-10-CM

## 2023-12-05 DIAGNOSIS — R10823 Right lower quadrant rebound abdominal tenderness: Secondary | ICD-10-CM

## 2023-12-06 ENCOUNTER — Ambulatory Visit
Admission: RE | Admit: 2023-12-06 | Discharge: 2023-12-06 | Disposition: A | Discharge status: Home / Self Care | Source: Ambulatory Visit | Attending: Gerontology | Admitting: Gerontology

## 2023-12-06 DIAGNOSIS — R935 Abnormal findings on diagnostic imaging of other abdominal regions, including retroperitoneum: Secondary | ICD-10-CM | POA: Insufficient documentation

## 2023-12-06 DIAGNOSIS — R10823 Right lower quadrant rebound abdominal tenderness: Secondary | ICD-10-CM | POA: Diagnosis present

## 2023-12-06 DIAGNOSIS — N83201 Unspecified ovarian cyst, right side: Secondary | ICD-10-CM | POA: Insufficient documentation

## 2024-08-05 ENCOUNTER — Other Ambulatory Visit: Payer: Self-pay | Admitting: Gerontology

## 2024-08-05 DIAGNOSIS — Z1231 Encounter for screening mammogram for malignant neoplasm of breast: Secondary | ICD-10-CM

## 2024-10-16 ENCOUNTER — Ambulatory Visit
Admission: RE | Admit: 2024-10-16 | Discharge: 2024-10-16 | Disposition: A | Discharge status: Home / Self Care | Source: Ambulatory Visit | Attending: Gerontology | Admitting: Gerontology

## 2024-10-16 DIAGNOSIS — Z1231 Encounter for screening mammogram for malignant neoplasm of breast: Secondary | ICD-10-CM | POA: Diagnosis present
